# Patient Record
Sex: Female | Born: 2000 | Race: Black or African American | Hispanic: No | Marital: Single | Smoking: Never smoker
Health system: Southern US, Community
[De-identification: ages and names within clinical notes are randomized; demographics above are authoritative.]

## PROBLEM LIST (undated history)

## (undated) DIAGNOSIS — Z789 Other specified health status: Secondary | ICD-10-CM

## (undated) DIAGNOSIS — S329XXA Fracture of unspecified parts of lumbosacral spine and pelvis, initial encounter for closed fracture: Secondary | ICD-10-CM

## (undated) DIAGNOSIS — O139 Gestational [pregnancy-induced] hypertension without significant proteinuria, unspecified trimester: Secondary | ICD-10-CM

## (undated) HISTORY — DX: Gestational (pregnancy-induced) hypertension without significant proteinuria, unspecified trimester: O13.9

## (undated) HISTORY — PX: NO PAST SURGERIES: SHX2092

## (undated) HISTORY — DX: Other specified health status: Z78.9

---
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
## 2018-07-26 DIAGNOSIS — Z113 Encounter for screening for infections with a predominantly sexual mode of transmission: Secondary | ICD-10-CM | POA: Diagnosis not present

## 2018-07-27 DIAGNOSIS — Z0101 Encounter for examination of eyes and vision with abnormal findings: Secondary | ICD-10-CM | POA: Diagnosis not present

## 2018-07-27 DIAGNOSIS — H538 Other visual disturbances: Secondary | ICD-10-CM | POA: Diagnosis not present

## 2018-07-31 DIAGNOSIS — Z3481 Encounter for supervision of other normal pregnancy, first trimester: Secondary | ICD-10-CM | POA: Diagnosis not present

## 2018-07-31 DIAGNOSIS — Z3491 Encounter for supervision of normal pregnancy, unspecified, first trimester: Secondary | ICD-10-CM | POA: Diagnosis not present

## 2018-07-31 DIAGNOSIS — Z01419 Encounter for gynecological examination (general) (routine) without abnormal findings: Secondary | ICD-10-CM | POA: Diagnosis not present

## 2018-07-31 DIAGNOSIS — Z3A10 10 weeks gestation of pregnancy: Secondary | ICD-10-CM | POA: Diagnosis not present

## 2018-08-29 DIAGNOSIS — O351XX0 Maternal care for (suspected) chromosomal abnormality in fetus, not applicable or unspecified: Secondary | ICD-10-CM | POA: Diagnosis not present

## 2018-08-29 DIAGNOSIS — Z3492 Encounter for supervision of normal pregnancy, unspecified, second trimester: Secondary | ICD-10-CM | POA: Diagnosis not present

## 2018-08-29 DIAGNOSIS — Z3A14 14 weeks gestation of pregnancy: Secondary | ICD-10-CM | POA: Diagnosis not present

## 2018-08-29 DIAGNOSIS — N390 Urinary tract infection, site not specified: Secondary | ICD-10-CM | POA: Diagnosis not present

## 2018-09-26 DIAGNOSIS — Z3A18 18 weeks gestation of pregnancy: Secondary | ICD-10-CM | POA: Diagnosis not present

## 2018-09-26 DIAGNOSIS — Z3492 Encounter for supervision of normal pregnancy, unspecified, second trimester: Secondary | ICD-10-CM | POA: Diagnosis not present

## 2018-10-08 DIAGNOSIS — R109 Unspecified abdominal pain: Secondary | ICD-10-CM | POA: Diagnosis not present

## 2018-10-08 DIAGNOSIS — M7918 Myalgia, other site: Secondary | ICD-10-CM | POA: Diagnosis not present

## 2018-10-08 DIAGNOSIS — O26892 Other specified pregnancy related conditions, second trimester: Secondary | ICD-10-CM | POA: Diagnosis not present

## 2018-10-08 DIAGNOSIS — Z3A20 20 weeks gestation of pregnancy: Secondary | ICD-10-CM | POA: Diagnosis not present

## 2018-10-10 DIAGNOSIS — Z3492 Encounter for supervision of normal pregnancy, unspecified, second trimester: Secondary | ICD-10-CM | POA: Diagnosis not present

## 2018-10-10 DIAGNOSIS — Z3A20 20 weeks gestation of pregnancy: Secondary | ICD-10-CM | POA: Diagnosis not present

## 2018-10-10 DIAGNOSIS — Z362 Encounter for other antenatal screening follow-up: Secondary | ICD-10-CM | POA: Diagnosis not present

## 2018-10-12 ENCOUNTER — Encounter: Payer: Self-pay | Admitting: General Practice

## 2018-10-18 ENCOUNTER — Encounter: Payer: Self-pay | Admitting: *Deleted

## 2018-10-18 DIAGNOSIS — Z3400 Encounter for supervision of normal first pregnancy, unspecified trimester: Secondary | ICD-10-CM | POA: Insufficient documentation

## 2018-10-24 ENCOUNTER — Encounter: Payer: Self-pay | Admitting: General Practice

## 2018-10-24 ENCOUNTER — Encounter: Payer: Self-pay | Admitting: Advanced Practice Midwife

## 2018-10-24 ENCOUNTER — Ambulatory Visit (INDEPENDENT_AMBULATORY_CARE_PROVIDER_SITE_OTHER): Payer: Medicaid Other | Admitting: Advanced Practice Midwife

## 2018-10-24 ENCOUNTER — Other Ambulatory Visit: Payer: Self-pay

## 2018-10-24 DIAGNOSIS — Z3402 Encounter for supervision of normal first pregnancy, second trimester: Secondary | ICD-10-CM

## 2018-10-24 DIAGNOSIS — Z3400 Encounter for supervision of normal first pregnancy, unspecified trimester: Secondary | ICD-10-CM

## 2018-10-24 DIAGNOSIS — Z3A22 22 weeks gestation of pregnancy: Secondary | ICD-10-CM | POA: Diagnosis not present

## 2018-10-24 NOTE — Progress Notes (Signed)
Subjective:   Shannon Hogan is a 18 y.o. G1P0 at 3641w2d by LMP being seen today for her first obstetrical visit.  Her obstetrical history is significant for none. Patient does intend to breast feed. Pregnancy history fully reviewed.  Patient reports backache.  HISTORY: OB History  Gravida Para Term Preterm AB Living  1 0 0 0 0 0  SAB TAB Ectopic Multiple Live Births  0 0 0 0 0    # Outcome Date GA Lbr Len/2nd Weight Sex Delivery Anes PTL Lv  1 Current             Last pap smear was done NA, age and was NA age  Past Medical History:  Diagnosis Date  . Medical history non-contributory    Past Surgical History:  Procedure Laterality Date  . NO PAST SURGERIES     Family History  Problem Relation Age of Onset  . Stroke Paternal Grandmother   . Glaucoma Paternal Grandmother   . Diabetes Paternal Grandmother    Social History   Tobacco Use  . Smoking status: Never Smoker  . Smokeless tobacco: Never Used  Substance Use Topics  . Alcohol use: No  . Drug use: Not Currently    Types: Marijuana   No Known Allergies Current Outpatient Medications on File Prior to Visit  Medication Sig Dispense Refill  . aspirin EC 81 MG tablet Take 81 mg by mouth daily.    . Prenatal Vit-Fe Fumarate-FA (PRENATAL VITAMIN PO) Take 1 tablet by mouth daily.     No current facility-administered medications on file prior to visit.     Review of Systems Pertinent items noted in HPI and remainder of comprehensive ROS otherwise negative.  Exam   Vitals:   10/18/18 1144 10/24/18 0823  BP:  109/68  Pulse:  96  Temp:  98.4 F (36.9 C)  Weight:  137 lb 3.2 oz (62.2 kg)  Height: 5' 2.5" (1.588 m)    Fetal Heart Rate (bpm): 148  Physical Exam  Constitutional: She is oriented to person, place, and time and well-developed, well-nourished, and in no distress. No distress.  HENT:  Head: Normocephalic.  Cardiovascular: Normal rate.  Pulmonary/Chest: Effort normal.  Abdominal: Soft. There is  no abdominal tenderness. There is no rebound.  Neurological: She is alert and oriented to person, place, and time.  Skin: Skin is warm and dry.  Psychiatric: Affect normal.  Nursing note and vitals reviewed.    Assessment:   Pregnancy: G1P0 Patient Active Problem List   Diagnosis Date Noted  . Supervision of normal first pregnancy, antepartum 10/18/2018     Plan:  1. Supervision of normal first pregnancy, antepartum - reviewed prenatal records  - 28 week labs at next visit - patient reports that prior OB office called and wanted to schedule a repeat US. Results not available. Will request records and schedule US if needed. US report on file has mild bilateral renal pyelectasis reported. Unsure if this is why they would like to repeat the US.  - Enroll Patient in Babyscripts   Initial labs drawn. Continue prenatal vitamins. Genetic Screening discussed, reviewed records : results pending from prior office . Ultrasound discussed; fetal anatomic survey: results pending from prior office. . Problem list reviewed and updated. The nature of [facility] [facility] with multiple MDs and other Advanced Practice Providers was explained to patient; also emphasized that residents, students are part of our team. Routine obstetric precautions reviewed. 50% of 45 min visit  spent in counseling and coordination of care. Return in about 6 weeks (around 12/05/2018) for in person visit and 28 week labs .  Marcille Buffy DNP, CNM  10/24/18  9:14 AM

## 2018-10-24 NOTE — Patient Instructions (Addendum)
Marland [facility], MD; Suzan Slick, MD o [place], [place], [place] [address] o [phone] o Mon-Fri 8:30-5:00, Sat 8:30-12:00 o Providers come to see babies at [facility] o Does NOT accept Medicaid  Childbirth Education Options: [facility] Department Classes:  Childbirth education classes can help you get ready for a positive parenting experience. You can also meet other expectant parents and get free stuff for your baby. Each class runs for five weeks on the same night and costs $45 for the mother-to-be and her support person. Medicaid covers the cost if you are eligible. Call [phone] to register. [facility] Childbirth Education:  [phone] or [phone] or [email or web address]  Baby & Me Class: Discuss newborn & infant parenting and family adjustment issues with other new mothers in a relaxed environment. Each week brings a new speaker or baby-centered activity. We encourage new mothers to join [place] every Thursday at 11:00am. Babies birth until crawling. No registration or fee. Daddy [employer]: This course offers Dads-to-be the tools and knowledge needed to feel confident on their journey to becoming new fathers. Experienced dads, who have been trained as coaches, teach dads-to-be how to hold, comfort, diaper, swaddle and play with their infant while being able to support the new mom as well. A class for men taught by men. $25/dad Big Brother/Big Sister: Let your children share in the joy of a new brother or sister in this special class designed just for them. Class includes discussion about how families care for babies: swaddling, holding, diapering, safety as well as how they can be helpful in their new role. This class is designed for children ages 31 to 1, but any age is welcome. Please register each child individually. $5/child  Mom Talk: This mom-led group offers support and connection to mothers as they journey through  the adjustments and struggles of that sometimes overwhelming first year after the birth of a child. Tuesdays at 10:00am and Thursdays at 6:00pm. Babies welcome. No registration or fee. Breastfeeding Support Group: This group is a mother-to-mother support circle where moms have the opportunity to share their breastfeeding experiences. A Lactation Consultant is present for questions and concerns. Meets each Tuesday at 11:00am. No fee or registration. Breastfeeding Your Baby: Learn what to expect in the first days of breastfeeding your newborn.  This class will help you feel more confident with the skills needed to begin your breastfeeding experience. Many new mothers are concerned about breastfeeding after leaving the hospital. This class will also address the most common fears and challenges about breastfeeding during the first few weeks, months and beyond. (call for fee) Comfort Techniques and Tour: This 2 hour interactive class will provide you the opportunity to learn & practice hands-on techniques that can help relieve some of the discomfort of labor and encourage your baby to rotate toward the best position for birth. You and your partner will be able to try a variety of labor positions with birth balls and rebozos as well as practice breathing, relaxation, and visualization techniques. A tour of the [facility] is included with this class. $20 per registrant and support person Childbirth Class- Weekend Option: This class is a Weekend version of our Birth & Baby series. It is designed for parents who have a difficult time fitting several weeks of classes into their schedule. It covers the care of your newborn and the basics of labor and childbirth. It also includes a [place] [facility] and lunch. The class is held two consecutive days: beginning on Friday evening from 6:30 - 8:30 p.m. and the next day, Saturday from 9 a.m. - 4 p.m. (call for  fee) Doren Custard Class: Interested in a waterbirth?  This informational class will help you discover whether waterbirth is the right fit for you. Education about waterbirth itself, supplies you would need and how to assemble your support team is what you can expect from this class. Some obstetrical practices require this class in order to pursue a waterbirth. (Not all obstetrical practices offer waterbirth-check with your healthcare provider.) Register only the expectant mom, but you are encouraged to bring your partner to class! Required if planning waterbirth, no fee. Infant/Child CPR: Parents, grandparents, babysitters, and friends learn Cardio-Pulmonary Resuscitation skills for infants and children. You will also learn how to treat both conscious and unconscious choking in infants and children. This Family & Friends program does not offer certification. Register each participant individually to ensure that enough mannequins are available. (Call for fee) Grandparent Love: Expecting a grandbaby? This class is for you! Learn about the latest infant care and safety recommendations and ways to support your own child as he or she transitions into the parenting role. Taught by Registered Nurses who are childbirth instructors, but most importantly...they are grandmothers too! $10/person. Childbirth Class- Natural Childbirth: This series of 5 weekly classes is for expectant parents who want to learn and practice natural methods of coping with the process of labor and childbirth. Relaxation, breathing, massage, visualization, role of the partner, and helpful positioning are highlighted. Participants learn how to be confident in their body's ability to give birth. This class will empower and help parents make informed decisions about their own care. Includes discussion that will help new parents transition into the immediate postpartum period. [facility] is included. We suggest taking  this class between 25-32 weeks, but it's only a recommendation. $75 per registrant and one support person or $30 Medicaid. Childbirth Class- 3 week Series: This option of 3 weekly classes helps you and your labor partner prepare for childbirth. Newborn care, labor & birth, cesarean birth, pain management, and comfort techniques are discussed and a [place] [facility] is included. The class meets at the same time, on the same day of the week for 3 consecutive weeks beginning with the starting date you choose. $60 for registrant and one support person.  Marvelous Multiples: Expecting twins, triplets, or more? This class covers the differences in labor, birth, parenting, and breastfeeding issues that face multiples' parents. NICU tour is included. Led by a Certified Childbirth Educator who is the mother of twins. No fee. Caring for Baby: This class is for expectant and adoptive parents who want to learn and practice the most up-to-date newborn care for their babies. Focus is on birth through the first six weeks of life. Topics include feeding, bathing, diapering, crying, umbilical cord care, circumcision care and safe sleep. Parents learn to recognize symptoms of illness and when to call the pediatrician. Register only the mom-to-be and your partner or support person can plan to come with you! $10 per registrant and support person Childbirth Class- online option: This online class offers you the freedom to complete a Birth and Baby series in the comfort of your own home. The flexibility of this option allows you to review sections at your own pace, at times convenient to you and your support people. It includes additional video  information, animations, quizzes, and extended activities. Get organized with helpful eClass tools, checklists, and trackers. Once you register online for the class, you will receive an email within a few days to accept the invitation and begin the class when the  time is right for you. The content will be available to you for 60 days. $60 for 60 days of online access for you and your support people.  Local Doulas: Natural Baby Doulas [email or web address] Tel: [phone] [email or web address] [employer] [phone] [email or web address] The Labor Hassell Halim  (also do waterbirth tub rental) [phone] [email or web address] [facility] Birth Doula [phone] [email or web address] NotebookDistributors.fi [facility] Rhythms  [phone] https://[email or web address]-[email or web address]/ [employer] (PADA) [email or web address]://[email or web address]/ [place] [facility]  http://[email or web address]/ Considering Waterbirth? Guide for patients at [facility] [employer]  Why consider waterbirth?  . Gentle birth for babies . Less pain medicine used in labor . May allow for passive descent/less pushing . May reduce perineal tears  . More mobility and instinctive maternal position changes . Increased maternal relaxation . Reduced blood pressure in labor  Is waterbirth safe? What are the risks of infection, drowning or other complications?  . Infection: o Very low risk (3.7 % for tub vs 4.8% for bed) o 7 in 8000 waterbirths with documented infection o Poorly cleaned equipment most common cause o Slightly lower group B strep transmission rate  . Drowning o Maternal:  - Very low risk   - Related to seizures or fainting o Newborn:  - Very low risk. No evidence of increased risk of respiratory problems in multiple large studies - Physiological protection from breathing under water - Avoid underwater birth if there are any fetal complications - Once baby's head is out of the water, keep it out.  . Birth complication o Some reports of cord trauma, but risk decreased by bringing baby to surface gradually o No evidence  of increased risk of shoulder dystocia. Mothers can usually change positions faster in water than in a bed, possibly aiding the maneuvers to free the shoulder.   You must attend a Doren Custard class at [facility]  3rd Wednesday of every month from 7-9pm  [employer] by calling [phone] or online at [email or web address]  Bring [place] the certificate from the class to your prenatal appointment  Meet with a midwife at 36 weeks to see if you can still plan a waterbirth and to sign the consent.   Purchase or rent the following supplies:   Bathing suit top (optional)  Long-handled mirror (optional)  Places to purchase or rent supplies  GotWebTools.is for tub purchases and supplies  Waterbirthsolutions.com for tub purchases and supplies  The Labor Ladies (www.thelaborladies.com) $275 for tub rental/set-up & take down/kit   [employer] (http://www.[place].com/.htm) Information regarding doulas (labor support) who provide pool rentals  Our practice has a Birth Pool in a Box tub at the hospital that you may borrow on a first-come-first-served basis. It is your responsibility to to set up, clean and break down the tub. We cannot guarantee the availability of this tub in advance. You are responsible for bringing all accessories listed above. If you do not have all necessary supplies you cannot have a waterbirth.    Things that would prevent you from having a waterbirth:  Premature, <37wks  Previous cesarean birth  Presence of thick meconium-stained fluid  Multiple gestation (Twins, triplets, etc.)  Uncontrolled diabetes or gestational diabetes requiring medication  Hypertension requiring medication or diagnosis of pre-eclampsia  Heavy vaginal bleeding  Non-reassuring fetal  heart rate  Active infection (MRSA, etc.). Group B Strep is NOT a contraindication for  waterbirth.  If your labor has to be induced and induction method requires  continuous  monitoring of the baby's heart rate  Other risks/issues identified by your obstetrical provider  Please remember that birth is unpredictable. Under certain unforeseeable circumstances your provider may advise against giving birth in the tub. These decisions will be made on a case-by-case basis and with the safety of you and your baby as our highest priority.

## 2018-10-25 ENCOUNTER — Encounter: Payer: Self-pay | Admitting: General Practice

## 2018-10-26 ENCOUNTER — Other Ambulatory Visit: Payer: Self-pay | Admitting: Obstetrics & Gynecology

## 2018-10-26 MED ORDER — FLUCONAZOLE 150 MG PO TABS: 150.0000 mg | ORAL_TABLET | Freq: Once | ORAL | 3 refills | Status: AC

## 2018-10-26 NOTE — Progress Notes (Signed)
Diflucan prescribed per patient request.

## 2018-11-05 DIAGNOSIS — O0990 Supervision of high risk pregnancy, unspecified, unspecified trimester: Secondary | ICD-10-CM | POA: Diagnosis not present

## 2018-11-12 DIAGNOSIS — H5213 Myopia, bilateral: Secondary | ICD-10-CM | POA: Diagnosis not present

## 2018-11-21 ENCOUNTER — Other Ambulatory Visit: Payer: Self-pay

## 2018-11-21 ENCOUNTER — Ambulatory Visit (INDEPENDENT_AMBULATORY_CARE_PROVIDER_SITE_OTHER): Payer: Medicaid Other | Admitting: *Deleted

## 2018-11-21 ENCOUNTER — Other Ambulatory Visit (HOSPITAL_COMMUNITY)
Admission: RE | Admit: 2018-11-21 | Discharge: 2018-11-21 | Disposition: A | Discharge status: Home / Self Care | Payer: Medicaid Other | Source: Ambulatory Visit | Attending: Obstetrics and Gynecology | Admitting: Obstetrics and Gynecology

## 2018-11-21 VITALS — BP 119/70 | HR 128 | Temp 98.5°F | Ht 62.5 in | Wt 150.0 lb

## 2018-11-21 DIAGNOSIS — O26899 Other specified pregnancy related conditions, unspecified trimester: Secondary | ICD-10-CM | POA: Diagnosis not present

## 2018-11-21 DIAGNOSIS — N898 Other specified noninflammatory disorders of vagina: Secondary | ICD-10-CM

## 2018-11-21 NOTE — Progress Notes (Signed)
   SUBJECTIVE:  18 y.o. female complains of vaginal itching and vaginal discharge for several day(s). Denies abnormal vaginal bleeding or significant pelvic pain or fever. No UTI symptoms. Denies history of known exposure to STD.  Patient's last menstrual period was 05/06/2018 (exact date).  OBJECTIVE:  She appears well, afebrile. Urine dipstick: not done.  ASSESSMENT:  Vaginal Discharge  Vaginal Itching   PLAN:  GC, chlamydia, trichomonas, BVAG, CVAG probe sent to lab. Treatment: To be determined once lab results are received ROV prn if symptoms persist or worsen. Patient requested Rx for Diflucan. Advised patient that the nurse does not have standing orders for pregnant women for Diflucan. Will have to wait until Midwife review labs and record.  Derl Barrow, RN

## 2018-11-22 ENCOUNTER — Telehealth: Payer: Self-pay | Admitting: *Deleted

## 2018-11-22 LAB — CERVICOVAGINAL ANCILLARY ONLY
Bacterial Vaginitis (gardnerella): NEGATIVE
Candida Glabrata: NEGATIVE
Candida Vaginitis: POSITIVE — AB
Chlamydia: NEGATIVE
Molecular Disclaimer: NEGATIVE
Molecular Disclaimer: NEGATIVE
Molecular Disclaimer: NEGATIVE
Molecular Disclaimer: NEGATIVE
Molecular Disclaimer: NORMAL
Molecular Disclaimer: NORMAL
Neisseria Gonorrhea: NEGATIVE
Trichomonas: NEGATIVE

## 2018-11-22 MED ORDER — FLUCONAZOLE 150 MG PO TABS: 150.0000 mg | ORAL_TABLET | ORAL | 0 refills | Status: DC

## 2018-11-22 NOTE — Telephone Encounter (Addendum)
Patient called requesting results from visit 11/21/2018. She stated she is suffering and requested Diflucan be sent to pharmacy. Advised patient, the results are not back at this and the nurse did not have standing orders to send Diflucan, only vaginal cream. Patient stated she called and a nurse from our clinic just asked a midwife and sent Diflucan to pharmacy without being seen. Advised her again that Diflucan is not a standing order for the nurse to send in and would need to speak with midwife.  Verbal order given by Laury Deep, CNM for Diflucan 150 mg PO every 3 days x 3 (now, day 4 and day 7).  Derl Barrow, RN

## 2018-11-23 DIAGNOSIS — Z0101 Encounter for examination of eyes and vision with abnormal findings: Secondary | ICD-10-CM | POA: Diagnosis not present

## 2018-11-23 DIAGNOSIS — H5210 Myopia, unspecified eye: Secondary | ICD-10-CM | POA: Diagnosis not present

## 2018-11-26 ENCOUNTER — Telehealth: Payer: Self-pay | Admitting: *Deleted

## 2018-11-26 NOTE — Telephone Encounter (Signed)
Patient called requesting test results from last visit. Pt was positive for yeast, treatment was sent to pharmacy.  Pt also requested to speak with provider regarding having another ultrasound completed.  Pt has upcoming appt on 12/07/2018. Will forward to provider.  Derl Barrow, RN

## 2018-11-27 ENCOUNTER — Other Ambulatory Visit: Payer: Self-pay | Admitting: Advanced Practice Midwife

## 2018-11-27 DIAGNOSIS — O35EXX0 Maternal care for other (suspected) fetal abnormality and damage, fetal genitourinary anomalies, not applicable or unspecified: Secondary | ICD-10-CM

## 2018-11-27 DIAGNOSIS — Z3400 Encounter for supervision of normal first pregnancy, unspecified trimester: Secondary | ICD-10-CM

## 2018-11-27 DIAGNOSIS — O358XX0 Maternal care for other (suspected) fetal abnormality and damage, not applicable or unspecified: Secondary | ICD-10-CM

## 2018-11-28 ENCOUNTER — Telehealth: Payer: Self-pay | Admitting: General Practice

## 2018-11-28 NOTE — Telephone Encounter (Signed)
Pt aware of [place] appt scheduled for 12/13/2018 at 10:30am and voiced understanding.

## 2018-12-07 ENCOUNTER — Ambulatory Visit (INDEPENDENT_AMBULATORY_CARE_PROVIDER_SITE_OTHER): Payer: Medicaid Other | Admitting: Advanced Practice Midwife

## 2018-12-07 ENCOUNTER — Encounter: Payer: Self-pay | Admitting: Advanced Practice Midwife

## 2018-12-07 ENCOUNTER — Other Ambulatory Visit: Payer: Self-pay

## 2018-12-07 VITALS — BP 114/79 | HR 114 | Temp 98.4°F | Wt 152.0 lb

## 2018-12-07 DIAGNOSIS — Z3A28 28 weeks gestation of pregnancy: Secondary | ICD-10-CM

## 2018-12-07 DIAGNOSIS — Z23 Encounter for immunization: Secondary | ICD-10-CM

## 2018-12-07 DIAGNOSIS — Z3403 Encounter for supervision of normal first pregnancy, third trimester: Secondary | ICD-10-CM

## 2018-12-07 DIAGNOSIS — Z3400 Encounter for supervision of normal first pregnancy, unspecified trimester: Secondary | ICD-10-CM | POA: Diagnosis not present

## 2018-12-07 NOTE — Patient Instructions (Signed)

## 2018-12-07 NOTE — Progress Notes (Signed)
   PRENATAL VISIT NOTE  Subjective:  Shannon Hogan is a 18 y.o. G1P0 at [redacted]w[redacted]d being seen today for ongoing prenatal care.  She is currently monitored for the following issues for this low-risk pregnancy and has Supervision of normal first pregnancy, antepartum on their problem list.  Patient reports no complaints.  Contractions: Not present. Vag. Bleeding: None.  Movement: Present. Denies leaking of fluid.   The following portions of the patient's history were reviewed and updated as appropriate: allergies, current medications, past family history, past medical history, past social history, past surgical history and problem list.   Objective:   Vitals:   12/07/18 0815  BP: 114/79  Pulse: (!) 114  Temp: 98.4 F (36.9 C)  Weight: 152 lb (68.9 kg)    Fetal Status:   Fundal Height: 29 cm Movement: Present  Presentation: Complete Breech  General:  Alert, oriented and cooperative. Patient is in no acute distress.  Skin: Skin is warm and dry. No rash noted.   Cardiovascular: Normal heart rate noted  Respiratory: Normal respiratory effort, no problems with respiration noted  Abdomen: Soft, gravid, appropriate for gestational age.  Pain/Pressure: Present     Pelvic: Cervical exam deferred        Extremities: Normal range of motion.  Edema: None  Mental Status: Normal mood and affect. Normal behavior. Normal judgment and thought content.   Assessment and Plan:  Pregnancy: G1P0 at [redacted]w[redacted]d 1. Supervision of normal first pregnancy, antepartum - Routine care - Glucose Tolerance, 2 Hours w/1 Hour - HIV Antibody (routine testing w rflx) - RPR - CBC - Tdap vaccine greater than or equal to 7yo IM - Breech by leopolds today   2. Need for tetanus, diphtheria, and acellular pertussis (Tdap) vaccine in patient of adolescent age or older - Tdap vaccine greater than or equal to 7yo IM  Preterm labor symptoms and general obstetric precautions including but not limited to vaginal bleeding,  contractions, leaking of fluid and fetal movement were reviewed in detail with the patient. Please refer to After Visit Summary for other counseling recommendations.   Return in about 2 weeks (around 12/21/2018) for Virtual visit .  Future Appointments  Date Time Provider [place]  12/13/2018 10:30 AM WH-MFC [place] 1 WH-MFCUS MFC-US    Jeda Pardue DNP, CNM  12/07/18  8:42 AM

## 2018-12-08 LAB — CBC
Hematocrit: 30.5 % — ABNORMAL LOW (ref 34.0–46.6)
Hemoglobin: 9.7 g/dL — ABNORMAL LOW (ref 11.1–15.9)
MCH: 26.1 pg — ABNORMAL LOW (ref 26.6–33.0)
MCHC: 31.8 g/dL (ref 31.5–35.7)
MCV: 82 fL (ref 79–97)
Platelets: 227 10*3/uL (ref 150–450)
RBC: 3.72 x10E6/uL — ABNORMAL LOW (ref 3.77–5.28)
RDW: 12.6 % (ref 11.7–15.4)
WBC: 11.1 10*3/uL — ABNORMAL HIGH (ref 3.4–10.8)

## 2018-12-08 LAB — GLUCOSE TOLERANCE, 2 HOURS W/ 1HR
Glucose, 1 hour: 165 mg/dL (ref 65–179)
Glucose, 2 hour: 109 mg/dL (ref 65–152)
Glucose, Fasting: 83 mg/dL (ref 65–91)

## 2018-12-08 LAB — RPR: RPR Ser Ql: NONREACTIVE

## 2018-12-08 LAB — HIV ANTIBODY (ROUTINE TESTING W REFLEX): HIV Screen 4th Generation wRfx: NONREACTIVE

## 2018-12-13 ENCOUNTER — Other Ambulatory Visit: Payer: Self-pay

## 2018-12-13 ENCOUNTER — Other Ambulatory Visit (HOSPITAL_COMMUNITY): Payer: Self-pay | Admitting: *Deleted

## 2018-12-13 ENCOUNTER — Ambulatory Visit (HOSPITAL_COMMUNITY)
Admission: RE | Admit: 2018-12-13 | Discharge: 2018-12-13 | Disposition: A | Discharge status: Home / Self Care | Payer: Medicaid Other | Source: Ambulatory Visit | Attending: Obstetrics and Gynecology | Admitting: Obstetrics and Gynecology

## 2018-12-13 ENCOUNTER — Other Ambulatory Visit: Payer: Self-pay | Admitting: Advanced Practice Midwife

## 2018-12-13 DIAGNOSIS — Z3400 Encounter for supervision of normal first pregnancy, unspecified trimester: Secondary | ICD-10-CM | POA: Insufficient documentation

## 2018-12-13 DIAGNOSIS — O35EXX0 Maternal care for other (suspected) fetal abnormality and damage, fetal genitourinary anomalies, not applicable or unspecified: Secondary | ICD-10-CM

## 2018-12-13 DIAGNOSIS — O358XX0 Maternal care for other (suspected) fetal abnormality and damage, not applicable or unspecified: Secondary | ICD-10-CM

## 2018-12-13 DIAGNOSIS — O3660X0 Maternal care for excessive fetal growth, unspecified trimester, not applicable or unspecified: Secondary | ICD-10-CM

## 2018-12-13 DIAGNOSIS — Z3A29 29 weeks gestation of pregnancy: Secondary | ICD-10-CM | POA: Diagnosis not present

## 2018-12-19 ENCOUNTER — Telehealth: Payer: Medicaid Other | Admitting: Obstetrics and Gynecology

## 2018-12-25 ENCOUNTER — Encounter: Payer: Self-pay | Admitting: General Practice

## 2018-12-26 ENCOUNTER — Encounter: Payer: Self-pay | Admitting: General Practice

## 2018-12-26 ENCOUNTER — Telehealth: Payer: Self-pay | Admitting: General Practice

## 2018-12-26 NOTE — Telephone Encounter (Signed)
Pt called office after hours and was triaged by RN and it was recommended that pt go to [facility]U.  Pt did not go to the hospital.  Called patient to schedule follow up appt that she missed on 12/19/2018 via Mychart.  Pt did not answer.  Left message on VM for pt to give our office a call to reschedule.

## 2018-12-27 ENCOUNTER — Other Ambulatory Visit (HOSPITAL_COMMUNITY)
Admission: RE | Admit: 2018-12-27 | Discharge: 2018-12-27 | Disposition: A | Discharge status: Home / Self Care | Payer: Medicaid Other | Source: Ambulatory Visit | Attending: Obstetrics and Gynecology | Admitting: Obstetrics and Gynecology

## 2018-12-27 ENCOUNTER — Encounter: Payer: Self-pay | Admitting: Obstetrics and Gynecology

## 2018-12-27 ENCOUNTER — Other Ambulatory Visit: Payer: Self-pay

## 2018-12-27 ENCOUNTER — Ambulatory Visit (INDEPENDENT_AMBULATORY_CARE_PROVIDER_SITE_OTHER): Payer: Medicaid Other | Admitting: Obstetrics and Gynecology

## 2018-12-27 VITALS — BP 125/74 | HR 118 | Temp 98.5°F | Wt 155.8 lb

## 2018-12-27 DIAGNOSIS — B373 Candidiasis of vulva and vagina: Secondary | ICD-10-CM

## 2018-12-27 DIAGNOSIS — O26899 Other specified pregnancy related conditions, unspecified trimester: Secondary | ICD-10-CM

## 2018-12-27 DIAGNOSIS — Z3400 Encounter for supervision of normal first pregnancy, unspecified trimester: Secondary | ICD-10-CM

## 2018-12-27 DIAGNOSIS — Z3A31 31 weeks gestation of pregnancy: Secondary | ICD-10-CM

## 2018-12-27 DIAGNOSIS — N898 Other specified noninflammatory disorders of vagina: Secondary | ICD-10-CM

## 2018-12-27 DIAGNOSIS — O98813 Other maternal infectious and parasitic diseases complicating pregnancy, third trimester: Secondary | ICD-10-CM

## 2018-12-27 DIAGNOSIS — O26893 Other specified pregnancy related conditions, third trimester: Secondary | ICD-10-CM

## 2018-12-27 DIAGNOSIS — B3731 Acute candidiasis of vulva and vagina: Secondary | ICD-10-CM

## 2018-12-27 DIAGNOSIS — B372 Candidiasis of skin and nail: Secondary | ICD-10-CM

## 2018-12-27 MED ORDER — NYSTATIN 100000 UNIT/GM EX OINT: 1.0000 "application " | TOPICAL_OINTMENT | Freq: Two times a day (BID) | CUTANEOUS | 0 refills | Status: DC

## 2018-12-27 MED ORDER — FLUCONAZOLE 150 MG PO TABS: 150.0000 mg | ORAL_TABLET | ORAL | 0 refills | Status: AC

## 2018-12-27 NOTE — Progress Notes (Signed)
     LOW-RISK PREGNANCY OFFICE VISIT Patient name: Shannon Hogan MRN [id number]  Date of birth: 29-Jan-2001 Chief Complaint:   Routine Prenatal Visit  History of Present Illness:   Shannon Hogan is a 18 y.o. G1P0 female at [redacted]w[redacted]d with an Estimated Date of Delivery: 02/25/19 being seen today for ongoing management of a low-risk pregnancy.  Today she reports vaginal irritation and thick, off-white vaginal discharge and vaginal itching. Contractions: Irregular. Vag. Bleeding: None.  Movement: Present. denies leaking of fluid. Review of Systems:   Pertinent items are noted in HPI Denies abnormal vaginal discharge w/ itching/odor/irritation, headaches, visual changes, shortness of breath, chest pain, abdominal pain, severe nausea/vomiting, or problems with urination or bowel movements unless otherwise stated above. Pertinent History Reviewed:  Reviewed past medical,surgical, social, obstetrical and family history.  Reviewed problem list, medications and allergies. Physical Assessment:   Vitals:   12/27/18 1550  BP: 125/74  Pulse: (!) 118  Temp: 98.5 F (36.9 C)  Weight: 155 lb 12.8 oz (70.7 kg)  Body mass index is 28.04 kg/m.        Physical Examination:   General appearance: Well appearing, and in no distress  Mental status: Alert, oriented to person, place, and time  Skin: Warm & dry  Cardiovascular: Normal heart rate noted  Respiratory: Normal respiratory effort, no distress  Abdomen: Soft, gravid, nontender  Pelvic: Cervical exam deferred         Extremities: Edema: None  Fetal Status: Fetal Heart Rate (bpm): 154 Fundal Height: 32 cm Movement: Present    Assessment & Plan:  1) Low-risk pregnancy G1P0 at [redacted]w[redacted]d with an Estimated Date of Delivery: 02/25/19   2) Supervision of normal first pregnancy, antepartum  Vaginal discharge during pregnancy, antepartum  - Plan: Cervicovaginal ancillary only( )  3) Candida vaginitis  - Rx for fluconazole (DIFLUCAN) 150 MG  tablet  4) Cutaneous candidiasis  -  Rx for nystatin ointment (MYCOSTATIN)     Meds:  Meds ordered this encounter  Medications  . fluconazole (DIFLUCAN) 150 MG tablet    Sig: Take 1 tablet (150 mg total) by mouth every 3 (three) days for 3 doses. Take 1 tablet today, Sunday 12/30/18 & 01/01/19    Dispense:  3 tablet    Refill:  0    Order Specific Question:   Supervising Provider    Answer:   Donnamae Jude [[id number]]  . nystatin ointment (MYCOSTATIN)    Sig: Apply 1 application topically 2 (two) times daily. Apply to external areas only    Dispense:  30 g    Refill:  0    Order Specific Question:   Supervising Provider    Answer:   Donnamae Jude [[id number]]   Labs/procedures today: none  Plan:  Continue routine obstetrical care   Reviewed: Preterm labor symptoms and general obstetric precautions including but not limited to vaginal bleeding, contractions, leaking of fluid and fetal movement were reviewed in detail with the patient.  All questions were answered. Has home bp cuff.  Check bp weekly, let us know if >140/90.   Follow-up: Return in about 4 weeks (around 01/24/2019) for Return OB w/GBS.  No orders of the defined types were placed in this encounter.  Laury Deep MSN, CNM 12/27/2018 4:20 PM

## 2018-12-27 NOTE — Patient Instructions (Signed)
Alternative Vaginitis Therapies  1) soak in tub of warm water waist high with 1/2 cup of baking soda in water for ~ 20 mins. 2) soak 3 tampons in 1 tablespoon of fractionated (liquid form) coconut oil with 10 drops of Melaleuca (Tea Tree) essential oil, insert 1 saturated tampon vaginally at bedtime x 3 days. Both options are to be done after sexual intercourse, menses and when suspects BV and/or yeast infection. Advised that these alternatives will not replace the need to be evaluated, if sx's persist. You will need to seek care at an OB/GYN provider.  GO WHITE: Soap: UNSCENTED Dove (white box light green writing) Laundry detergent (underwear)- Dreft or [employer] unscented WHITE 100% cotton panties (NOT just cotton crouch) Sanitary napkin/panty liners: UNSCENTED.  If it doesn't SAY unscented it can have a scent/perfume    NO PERFUMES OR LOTIONS OR POTIONS in the vulvar area (may use regular [place]) Condoms: hypoallergenic only. Non dyed (no color) Toilet papers: white only Wash clothes: use a separate wash cloth. WHITE.  Wash in Dreft.   You can purchase Tea Tree Oil locally at:  [employer] [address] [place], [place] [address] [phone]   [employer] [address] [place], [place] [address] [phone]  

## 2019-01-01 LAB — CERVICOVAGINAL ANCILLARY ONLY
Bacterial Vaginitis (gardnerella): NEGATIVE
Candida Glabrata: NEGATIVE
Candida Vaginitis: POSITIVE — AB
Chlamydia: NEGATIVE
Comment: NEGATIVE
Comment: NEGATIVE
Comment: NEGATIVE
Comment: NEGATIVE
Comment: NEGATIVE
Comment: NORMAL
Neisseria Gonorrhea: NEGATIVE
Trichomonas: NEGATIVE

## 2019-01-02 ENCOUNTER — Telehealth: Payer: Self-pay | Admitting: *Deleted

## 2019-01-02 DIAGNOSIS — B373 Candidiasis of vulva and vagina: Secondary | ICD-10-CM

## 2019-01-02 DIAGNOSIS — B3731 Acute candidiasis of vulva and vagina: Secondary | ICD-10-CM

## 2019-01-02 MED ORDER — TERCONAZOLE 0.4 % VA CREA: 1.0000 | TOPICAL_CREAM | Freq: Every day | VAGINAL | 0 refills | Status: DC

## 2019-01-02 NOTE — Telephone Encounter (Signed)
-----   Message from Laury Deep, [place] sent at 01/02/2019  9:19 AM EST ----- Please treat for yeast

## 2019-01-10 ENCOUNTER — Ambulatory Visit (HOSPITAL_COMMUNITY)
Admission: RE | Admit: 2019-01-10 | Discharge: 2019-01-10 | Disposition: A | Discharge status: Home / Self Care | Payer: Medicaid Other | Source: Ambulatory Visit | Attending: Obstetrics and Gynecology | Admitting: Obstetrics and Gynecology

## 2019-01-10 ENCOUNTER — Other Ambulatory Visit: Payer: Self-pay

## 2019-01-10 ENCOUNTER — Other Ambulatory Visit (HOSPITAL_COMMUNITY): Payer: Self-pay | Admitting: *Deleted

## 2019-01-10 DIAGNOSIS — Z3A33 33 weeks gestation of pregnancy: Secondary | ICD-10-CM

## 2019-01-10 DIAGNOSIS — O3663X0 Maternal care for excessive fetal growth, third trimester, not applicable or unspecified: Secondary | ICD-10-CM

## 2019-01-10 DIAGNOSIS — O3663X1 Maternal care for excessive fetal growth, third trimester, fetus 1: Secondary | ICD-10-CM | POA: Diagnosis not present

## 2019-01-10 DIAGNOSIS — Z362 Encounter for other antenatal screening follow-up: Secondary | ICD-10-CM | POA: Diagnosis not present

## 2019-01-10 DIAGNOSIS — O3660X0 Maternal care for excessive fetal growth, unspecified trimester, not applicable or unspecified: Secondary | ICD-10-CM | POA: Insufficient documentation

## 2019-01-15 ENCOUNTER — Encounter (HOSPITAL_COMMUNITY): Payer: Self-pay

## 2019-01-23 DIAGNOSIS — O3663X0 Maternal care for excessive fetal growth, third trimester, not applicable or unspecified: Secondary | ICD-10-CM | POA: Insufficient documentation

## 2019-01-24 ENCOUNTER — Other Ambulatory Visit (HOSPITAL_COMMUNITY)
Admission: RE | Admit: 2019-01-24 | Discharge: 2019-01-24 | Disposition: A | Discharge status: Home / Self Care | Payer: Medicaid Other | Source: Ambulatory Visit | Attending: Obstetrics and Gynecology | Admitting: Obstetrics and Gynecology

## 2019-01-24 ENCOUNTER — Other Ambulatory Visit: Payer: Self-pay

## 2019-01-24 ENCOUNTER — Ambulatory Visit (INDEPENDENT_AMBULATORY_CARE_PROVIDER_SITE_OTHER): Payer: Medicaid Other | Admitting: Obstetrics and Gynecology

## 2019-01-24 ENCOUNTER — Encounter: Payer: Self-pay | Admitting: Obstetrics and Gynecology

## 2019-01-24 VITALS — BP 124/77 | HR 97 | Temp 98.0°F | Wt 163.4 lb

## 2019-01-24 DIAGNOSIS — Z3A35 35 weeks gestation of pregnancy: Secondary | ICD-10-CM

## 2019-01-24 DIAGNOSIS — Z3400 Encounter for supervision of normal first pregnancy, unspecified trimester: Secondary | ICD-10-CM | POA: Diagnosis not present

## 2019-01-24 DIAGNOSIS — O3663X0 Maternal care for excessive fetal growth, third trimester, not applicable or unspecified: Secondary | ICD-10-CM

## 2019-01-24 NOTE — Patient Instructions (Signed)

## 2019-01-24 NOTE — Progress Notes (Signed)
   LOW-RISK PREGNANCY OFFICE VISIT Patient name: Shannon Hogan MRN [id number]  Date of birth: February 19, 2001 Chief Complaint:   Routine Prenatal Visit  History of Present Illness:   Elyssa Pendelton is a 18 y.o. G1P0 female at [redacted]w[redacted]d with an Estimated Date of Delivery: 02/25/19 being seen today for ongoing management of a low-risk pregnancy.  Today she reports no complaints. Contractions: Irregular. Vag. Bleeding: None.  Movement: Absent. denies leaking of fluid. Review of Systems:   Pertinent items are noted in HPI Denies abnormal vaginal discharge w/ itching/odor/irritation, headaches, visual changes, shortness of breath, chest pain, abdominal pain, severe nausea/vomiting, or problems with urination or bowel movements unless otherwise stated above. Pertinent History Reviewed:  Reviewed past medical,surgical, social, obstetrical and family history.  Reviewed problem list, medications and allergies. Physical Assessment:   Vitals:   01/24/19 0856  BP: 124/77  Pulse: 97  Temp: 98 F (36.7 C)  Weight: 163 lb 6.4 oz (74.1 kg)  Body mass index is 29.41 kg/m.        Physical Examination:   General appearance: Well appearing, and in no distress  Mental status: Alert, oriented to person, place, and time  Skin: Warm & dry  Cardiovascular: Normal heart rate noted  Respiratory: Normal respiratory effort, no distress  Abdomen: Soft, gravid, nontender  Pelvic: Cervical exam performed, wet prep obtained by blind swab method        Extremities: Edema: None  Fetal Status: Fetal Heart Rate (bpm): 147   Movement: Absent      Assessment & Plan:  1) Low-risk pregnancy G1P0 at [redacted]w[redacted]d with an Estimated Date of Delivery: 02/25/19   2) Supervision of normal first pregnancy, antepartum  - Cervicovaginal ancillary only( [place]) - Known (+) GBS bacteriuria  3) Macrosomia of fetus affecting management of mother in singleton pregnancy in third trimester - Scheduled for repeat U/S on 02/07/2019   Meds: No orders of the defined types were placed in this encounter.  Labs/procedures today: wet prep  Plan:  Continue routine obstetrical care   Reviewed: Preterm labor symptoms and general obstetric precautions including but not limited to vaginal bleeding, contractions, leaking of fluid and fetal movement were reviewed in detail with the patient.  All questions were answered. Has home bp cuff. Check bp weekly, let us know if >140/90.   Follow-up: Return in about 2 weeks (around 02/07/2019) for Return OB - My Chart video.   Laury Deep MSN, CNM 01/24/2019 9:27 AM

## 2019-01-29 LAB — CERVICOVAGINAL ANCILLARY ONLY
Bacterial Vaginitis (gardnerella): NEGATIVE
Candida Glabrata: NEGATIVE
Candida Vaginitis: NEGATIVE
Chlamydia: NEGATIVE
Comment: NEGATIVE
Comment: NEGATIVE
Comment: NEGATIVE
Comment: NEGATIVE
Comment: NEGATIVE
Comment: NORMAL
Neisseria Gonorrhea: NEGATIVE
Trichomonas: NEGATIVE

## 2019-02-01 ENCOUNTER — Inpatient Hospital Stay (HOSPITAL_COMMUNITY)
Admission: AD | Admit: 2019-02-01 | Discharge: 2019-02-01 | Disposition: A | Discharge status: Home / Self Care | Payer: Medicaid Other | Attending: Obstetrics and Gynecology | Admitting: Obstetrics and Gynecology

## 2019-02-01 ENCOUNTER — Other Ambulatory Visit: Payer: Self-pay

## 2019-02-01 ENCOUNTER — Encounter (HOSPITAL_COMMUNITY): Payer: Self-pay | Admitting: Obstetrics and Gynecology

## 2019-02-01 DIAGNOSIS — Z3A36 36 weeks gestation of pregnancy: Secondary | ICD-10-CM | POA: Insufficient documentation

## 2019-02-01 DIAGNOSIS — B373 Candidiasis of vulva and vagina: Secondary | ICD-10-CM | POA: Diagnosis not present

## 2019-02-01 DIAGNOSIS — O3663X0 Maternal care for excessive fetal growth, third trimester, not applicable or unspecified: Secondary | ICD-10-CM

## 2019-02-01 DIAGNOSIS — O98813 Other maternal infectious and parasitic diseases complicating pregnancy, third trimester: Secondary | ICD-10-CM | POA: Insufficient documentation

## 2019-02-01 DIAGNOSIS — B3731 Acute candidiasis of vulva and vagina: Secondary | ICD-10-CM

## 2019-02-01 DIAGNOSIS — Z7982 Long term (current) use of aspirin: Secondary | ICD-10-CM | POA: Diagnosis not present

## 2019-02-01 DIAGNOSIS — Z79899 Other long term (current) drug therapy: Secondary | ICD-10-CM | POA: Diagnosis not present

## 2019-02-01 DIAGNOSIS — Z3400 Encounter for supervision of normal first pregnancy, unspecified trimester: Secondary | ICD-10-CM

## 2019-02-01 MED ORDER — TERCONAZOLE 0.8 % VA CREA: 1.0000 | TOPICAL_CREAM | Freq: Every day | VAGINAL | 0 refills | Status: AC

## 2019-02-01 NOTE — MAU Provider Note (Addendum)
Chief Complaint:  Vaginal Discharge  First Provider Initiated Contact with Patient 02/01/19 1922      HPI: Shannon Hogan is a 18 y.o. G1P0 at [redacted]w[redacted]d by early ultrasound who presents to maternity admissions reporting pink discharge. Patient reports that she was using the bathroom and after she wiped she had some pinkish discharge on the toilet paper. This event occurred approximately 1 hour prior to presentation at MAU. Patient has never had discharge like this  before. Otherwise, patient endorses hx of yeast infections and notes some vaginal itching.   She reports good fetal movement and denies vaginal bleeding, urinary symptoms, h/a, dizziness, n/v, or fever/chills.     Past Medical History: Past Medical History:  Diagnosis Date  . Medical history non-contributory     Past obstetric history: OB History  Gravida Para Term Preterm AB Living  1            SAB TAB Ectopic Multiple Live Births               # Outcome Date GA Lbr Len/2nd Weight Sex Delivery Anes PTL Lv  1 Current             Past Surgical History: Past Surgical History:  Procedure Laterality Date  . NO PAST SURGERIES      Family History: Family History  Problem Relation Age of Onset  . Stroke Paternal Grandmother   . Glaucoma Paternal Grandmother   . Diabetes Paternal Grandmother   . Diabetes Mother   . Hypertension Father     Social History: Social History   Tobacco Use  . Smoking status: Never Smoker  . Smokeless tobacco: Never Used  Substance Use Topics  . Alcohol use: No  . Drug use: Not Currently    Types: Marijuana    Comment: last used in April 2020    Allergies: No Known Allergies  Meds:  Medications Prior to Admission  Medication Sig Dispense Refill Last Dose  . aspirin EC 81 MG tablet Take 81 mg by mouth daily.   02/01/2019 at Unknown time  . fluconazole (DIFLUCAN) 150 MG tablet Take 1 tablet (150 mg total) by mouth every 3 (three) days. Take one tablet today, on day 4 and day 7. 3  tablet 0 Past Month at Unknown time  . nystatin ointment (MYCOSTATIN) Apply 1 application topically 2 (two) times daily. Apply to external areas only 30 g 0 Past Month at Unknown time  . Prenatal Vit-Fe Phos-FA-Omega (VITAFOL GUMMIES) 3.33-0.333-34.8 MG CHEW Chew 3 each by mouth daily.   02/01/2019 at Unknown time  . terconazole (TERAZOL 7) 0.4 % vaginal cream Place 1 applicator vaginally at bedtime. 45 g 0 Unknown at Unknown time    ROS:  Noted in HPI. Otherwise negative.    I have reviewed patient's Past Medical Hx, Surgical Hx, Family Hx, Social Hx, medications and allergies.   Physical Exam   Patient Vitals for the past 24 hrs:  BP Temp Temp src Pulse Resp SpO2 Height Weight  02/01/19 1859 120/69 99 F (37.2 C) Oral (!) 132 20 99 % -- --  02/01/19 1840 128/69 98.1 F (36.7 C) -- (!) 108 16 99 % 5\' 2"  (1.575 m) 75.5 kg   Constitutional: Well-developed, well-nourished female in no acute distress.  Cardiovascular: normal rate Respiratory: normal effort GI: Abd soft, non-tender, gravid appropriate for gestational age.  MS: Extremities nontender, no edema, normal ROM Neurologic: Alert and oriented x 4.  GU: Mild irritation noted around external genitalia. Small  amount of white, curd-like discharge noted on sterile glove.  SVE: Dilation: Closed Effacement (%): Thick Cervical Position: Posterior Exam by:: Dr. Barrie Folk   NST -baseline: 160 -variability: moderate -accels: present -decels: none -interpretation: reactive   Labs: No results found for this or any previous visit (from the past 24 hour(s)).    Imaging:  none  MAU Course/MDM: -SVE revealed closed/thick/high cervix without SROM - Educated/reassured patient that it is normal to pass mucus in third trimester and that she is not currently in labor, FHR is reassuring, based on self report hard wiping it appears patient may have dislodged mucus during wiping -Terazol suppository-- hx of recurrent yeast  infections/current vaginal itching/observation of irritation noted around external genitalia suggestive of candidiasis   Orders Placed This Encounter  Procedures  . Discharge patient Discharge disposition: 01-Home or Self Care; Discharge patient date: 02/01/2019    Meds ordered this encounter  Medications  . terconazole (TERAZOL 3) 0.8 % vaginal cream    Sig: Place 1 applicator vaginally at bedtime for 3 days.    Dispense:  20 g    Refill:  0      Assessment: Patient is a 18 y.o. G1P0 at [redacted]w[redacted]d by early ultrasound who presented to maternity admissions self-reporting pink discharge who upon SVE confirmed closed/thick/high cervical position consistent with gestational age and no SROM. Patient endorses history of yeast infections and current vaginal itching and believes she may have wiped too hard.   1. [redacted] weeks gestation of pregnancy   2. Macrosomia of fetus affecting management of mother in singleton pregnancy in third trimester   3. Supervision of normal first pregnancy, antepartum   4. Vaginal candidiasis    Plan: 1. [redacted] weeks gestation of pregnancy - Low-risk pregnancy G1P0 at [redacted]w[redacted]d with an Estimated Date of Delivery: 02/25/19  - Reactive NST - Signs and symptoms of labor reviewed  2.Macrosomia of fetus affecting management of mother in singleton pregnancy in third trimester - Scheduled for repeat U/S on 02/07/2019  3. Vaginal candidiasis -Discharge home with Terazol 0.8% vaginal cream - Return precautions given  Hilario Quarry Medical Student 02/01/2019 7:44 PM   GME ATTESTATION:  I saw and evaluated the patient. I agree with the findings and the plan of care as documented in the student's note.  Marlowe Alt, DO OB Fellow, [facility] 02/01/2019 8:19 PM

## 2019-02-01 NOTE — Discharge Instructions (Signed)

## 2019-02-01 NOTE — MAU Note (Signed)
Pt is G1P0 at [redacted]w[redacted]d reporting pink mucous-like discharge about an hour ago. Also reporting round ligament pain. No contractions. +FM.

## 2019-02-04 ENCOUNTER — Encounter: Payer: Self-pay | Admitting: General Practice

## 2019-02-07 ENCOUNTER — Ambulatory Visit (HOSPITAL_COMMUNITY)
Admission: RE | Admit: 2019-02-07 | Discharge: 2019-02-07 | Disposition: A | Discharge status: Home / Self Care | Payer: Medicaid Other | Source: Ambulatory Visit | Attending: Obstetrics and Gynecology | Admitting: Obstetrics and Gynecology

## 2019-02-07 ENCOUNTER — Encounter: Payer: Self-pay | Admitting: Obstetrics and Gynecology

## 2019-02-07 ENCOUNTER — Encounter (HOSPITAL_COMMUNITY): Payer: Self-pay

## 2019-02-07 ENCOUNTER — Other Ambulatory Visit: Payer: Self-pay

## 2019-02-07 ENCOUNTER — Ambulatory Visit (HOSPITAL_COMMUNITY): Payer: Medicaid Other | Admitting: *Deleted

## 2019-02-07 ENCOUNTER — Telehealth (INDEPENDENT_AMBULATORY_CARE_PROVIDER_SITE_OTHER): Payer: Medicaid Other | Admitting: Obstetrics and Gynecology

## 2019-02-07 VITALS — BP 127/78 | HR 97 | Temp 97.8°F | Wt 166.0 lb

## 2019-02-07 DIAGNOSIS — Z3A37 37 weeks gestation of pregnancy: Secondary | ICD-10-CM

## 2019-02-07 DIAGNOSIS — O359XX0 Maternal care for (suspected) fetal abnormality and damage, unspecified, not applicable or unspecified: Secondary | ICD-10-CM | POA: Diagnosis not present

## 2019-02-07 DIAGNOSIS — O3663X0 Maternal care for excessive fetal growth, third trimester, not applicable or unspecified: Secondary | ICD-10-CM

## 2019-02-07 DIAGNOSIS — Z3400 Encounter for supervision of normal first pregnancy, unspecified trimester: Secondary | ICD-10-CM

## 2019-02-07 DIAGNOSIS — Z362 Encounter for other antenatal screening follow-up: Secondary | ICD-10-CM | POA: Diagnosis not present

## 2019-02-07 NOTE — Patient Instructions (Signed)
Your GBS results are POSITIVE. You will need to have antibiotics while in labor. Please seek medical care as soon as your water breaks or you think you are in labor. Please also tell the nurses and delivery providers that you are GBS POSITIVE, so they can make sure you receive the correct antibiotics protocol. As explained to you before, GBS is not harmful to you. The only reason we test for and give antibiotics for it is because the baby can get the bacteria in its body during delivery.    Group B Streptococcus Test During Pregnancy Why am I having this test? Routine testing, also called screening, for group B streptococcus (GBS) is recommended for all pregnant women between the 36th and 37th week of pregnancy. GBS is a type of bacteria that can be passed from mother to baby during childbirth. Screening will help guide whether or not you will need treatment during labor and delivery to prevent complications such as:  An infection in your uterus during labor.  An infection in your uterus after delivery.  A serious infection in your baby after delivery, such as pneumonia, meningitis, or sepsis. GBS screening is not often done before 36 weeks of pregnancy unless you go into labor prematurely. What happens if I have group B streptococcus? If testing shows that you have GBS, your health care provider will recommend treatment with IV antibiotics during labor and delivery. This treatment significantly decreases the risk of complications for you and your baby. If you have a planned C-section and you have GBS, you may not need to be treated with antibiotics because GBS is usually passed to babies after labor starts and your water breaks. If you are in labor or your water breaks before your C-section, it is possible for GBS to get into your uterus and be passed to your baby, so you might need treatment. Is there a chance I may not need to be tested? You may not need to be tested for GBS if:  You have a  urine test that shows GBS before 36 to 37 weeks.  You had a baby with GBS infection after a previous delivery. In these cases, you will automatically be treated for GBS during labor and delivery. What is being tested? This test is done to check if you have group B streptococcus in your vagina or rectum. What kind of sample is taken? To collect samples for this test, your health care provider will swab your vagina and rectum with a cotton swab. The sample is then sent to the lab to see if GBS is present. What happens during the test?   You will remove your clothing from the waist down.  You will lie down on an exam table in the same position as you would for a pelvic exam.  Your health care provider will swab your vagina and rectum to collect samples for a culture test.  You will be able to go home after the test and do all your usual activities. How are the results reported? The test results are reported as positive or negative. What do the results mean?  A positive test means you are at risk for passing GBS to your baby during labor and delivery. Your health care provider will recommend that you are treated with an IV antibiotic during labor and delivery.  A negative test means you are at very low risk of passing GBS to your baby. There is still a low risk of passing GBS to your baby  because sometimes test results may report that you do not have a condition when you do (false-negative result) or there is a chance that you may become infected with GBS after the test is done. You most likely will not need to be treated with an antibiotic during labor and delivery. Talk with your health care provider about what your results mean. Questions to ask your health care provider Ask your health care provider, or the department that is doing the test:  When will my results be ready?  How will I get my results?  What are my treatment options? Summary  Routine testing (screening) for group B  streptococcus (GBS) is recommended for all pregnant women between the 36th and 37th week of pregnancy.  GBS is a type of bacteria that can be passed from mother to baby during childbirth.  If testing shows that you have GBS, your health care provider will recommend that you are treated with IV antibiotics during labor and delivery. This treatment almost always prevents infection in newborns. This information is not intended to replace advice given to you by your health care provider. Make sure you discuss any questions you have with your health care provider. Document Released: 03/07/2018 Document Revised: 05/31/2018 Document Reviewed: 03/07/2018 Elsevier Patient Education  2020 [employer].

## 2019-02-07 NOTE — Progress Notes (Signed)
MY CHART VIDEO VIRTUAL OBSTETRICS VISIT ENCOUNTER NOTE  I connected with Joell Buerger on 02/07/19 at  2:30 PM EST by My Chart video at home and verified that I am speaking with the correct person using two identifiers.   I discussed the limitations, risks, security and privacy concerns of performing an evaluation and management service by My Chart video and the availability of in person appointments. I also discussed with the patient that there may be a patient responsible charge related to this service. The patient expressed understanding and agreed to proceed.  Subjective:  Shannon Hogan is a 18 y.o. G1P0 at [redacted]w[redacted]d being followed for ongoing prenatal care.  She is currently monitored for the following issues for this low-risk pregnancy and has Supervision of normal first pregnancy, antepartum and Macrosomia of fetus affecting management of mother in singleton pregnancy in third trimester on their problem list.  Patient reports increased pelvic pressure. She had U/S today and was told that baby weighs 9 lbs 7 oz today. She is afraid that she will not be able to push a baby that big out. She expressed a desire to go ahead and schedule a C/S. Reports fetal movement. Denies any contractions, bleeding or leaking of fluid.   The following portions of the patient's history were reviewed and updated as appropriate: allergies, current medications, past family history, past medical history, past social history, past surgical history and problem list.   Objective:   General:  Alert, oriented and cooperative.   Mental Status: Normal mood and affect perceived. Normal judgment and thought content.  Rest of physical exam deferred due to type of encounter  BP 127/78   Pulse 97   Temp 97.8 F (36.6 C)   Wt 166 lb (75.3 kg)   LMP 05/06/2018 (Exact Date)   BMI 30.36 kg/m  **Done by patient's own at home BP cuff and scale  Assessment and Plan:  Pregnancy: G1P0 at [redacted]w[redacted]d  1. Supervision of normal  first pregnancy, antepartum - Anticipatory guidance for upcoming labor - Explained that hospital will not allow is to schedule IOL for first time moms before 40 wks, because of the potential extended length of stay and likelihood of c-section with prolonged IOLs - Discussed can have her come in for office visit next week for cervical check and to get IOL scheduled for 40 wks  2. Macrosomia of fetus affecting management of mother in singleton pregnancy in third trimester - Advised that we don't schedule inductions or c-sections for suspected big babies; unless the baby is thought to be over 5000 grams (close to 11 lbs)   Term labor symptoms and general obstetric precautions including but not limited to vaginal bleeding, contractions, leaking of fluid and fetal movement were reviewed in detail with the patient.  I discussed the assessment and treatment plan with the patient. The patient was provided an opportunity to ask questions and all were answered. The patient agreed with the plan and demonstrated an understanding of the instructions. The patient was advised to call back or seek an in-person office evaluation/go to MAU at [facility] for any urgent or concerning symptoms. Please refer to After Visit Summary for other counseling recommendations.   I provided 10 minutes of non-face-to-face time during this encounter. There was 5 minutes of chart review time spent prior to this encounter. Total time spent = 10 minutes.  Return in about 1 week (around 02/14/2019) for  Return OB visit with cervical check and schedule IOL.  No future appointments.  Laury Deep, [place] [facility]

## 2019-02-20 ENCOUNTER — Other Ambulatory Visit: Payer: Self-pay

## 2019-02-20 ENCOUNTER — Encounter: Payer: Self-pay | Admitting: Advanced Practice Midwife

## 2019-02-20 ENCOUNTER — Encounter (HOSPITAL_COMMUNITY): Payer: Self-pay | Admitting: *Deleted

## 2019-02-20 ENCOUNTER — Telehealth (HOSPITAL_COMMUNITY): Payer: Self-pay | Admitting: *Deleted

## 2019-02-20 ENCOUNTER — Ambulatory Visit (INDEPENDENT_AMBULATORY_CARE_PROVIDER_SITE_OTHER): Payer: Medicaid Other | Admitting: Advanced Practice Midwife

## 2019-02-20 VITALS — BP 136/82 | HR 77 | Temp 98.3°F | Wt 169.8 lb

## 2019-02-20 DIAGNOSIS — Z3A39 39 weeks gestation of pregnancy: Secondary | ICD-10-CM

## 2019-02-20 DIAGNOSIS — Z3401 Encounter for supervision of normal first pregnancy, first trimester: Secondary | ICD-10-CM

## 2019-02-20 DIAGNOSIS — Z3403 Encounter for supervision of normal first pregnancy, third trimester: Secondary | ICD-10-CM

## 2019-02-20 MED ORDER — TERCONAZOLE 0.4 % VA CREA: 1.0000 | TOPICAL_CREAM | Freq: Every day | VAGINAL | 0 refills | Status: DC

## 2019-02-20 NOTE — Telephone Encounter (Signed)
Preadmission screen  

## 2019-02-20 NOTE — Progress Notes (Signed)
   PRENATAL VISIT NOTE  Subjective:  Shannon Hogan is a 18 y.o. G1P0 at [redacted]w[redacted]d being seen today for ongoing prenatal care.  She is currently monitored for the following issues for this low-risk pregnancy and has Supervision of normal first pregnancy, antepartum and Macrosomia of fetus affecting management of mother in singleton pregnancy in third trimester on their problem list.  Patient reports no complaints.  Contractions: Irregular. Vag. Bleeding: None.  Movement: Present. Denies leaking of fluid.   The following portions of the patient's history were reviewed and updated as appropriate: allergies, current medications, past family history, past medical history, past social history, past surgical history and problem list.   Objective:   Vitals:   02/20/19 0912  BP: 136/82  Pulse: 77  Temp: 98.3 F (36.8 C)  Weight: 169 lb 12.8 oz (77 kg)    Fetal Status: Fetal Heart Rate (bpm): 147 Fundal Height: 42 cm Movement: Present  Presentation: Vertex  General:  Alert, oriented and cooperative. Patient is in no acute distress.  Skin: Skin is warm and dry. No rash noted.   Cardiovascular: Normal heart rate noted  Respiratory: Normal respiratory effort, no problems with respiration noted  Abdomen: Soft, gravid, appropriate for gestational age.  Pain/Pressure: Present     Pelvic: Cervical exam performed Dilation: Closed Effacement (%): Thick Station: -2  Extremities: Normal range of motion.  Edema: Mild pitting, slight indentation  Mental Status: Normal mood and affect. Normal behavior. Normal judgment and thought content.   Assessment and Plan:  Pregnancy: G1P0 at [redacted]w[redacted]d 1. Encounter for supervision of normal first pregnancy in first trimester - 131/97 at home on 02/18/2019, but generally 130/80 at home.  - Orders placed - Will have patient monitor BP at home this week, and BP check in the office on Monday. Pre-eclampsia warning signs reviewed - BPP/NST next week - IOL scheduled, orders  placed   Preterm labor symptoms and general obstetric precautions including but not limited to vaginal bleeding, contractions, leaking of fluid and fetal movement were reviewed in detail with the patient. Please refer to After Visit Summary for other counseling recommendations.   Return in about 1 week (around 02/27/2019) for Post dates BPP/NST .  Future Appointments  Date Time Provider [place]  02/25/2019  8:50 AM [facility] NURSE [facility] None  02/27/2019  9:15 AM [facility] NST [facility] WOC  03/01/2019  7:30 AM [facility] [place] [facility] None    Marcille Buffy DNP, CNM  02/20/19  9:54 AM

## 2019-02-22 ENCOUNTER — Encounter (HOSPITAL_COMMUNITY): Payer: Self-pay | Admitting: Obstetrics and Gynecology

## 2019-02-22 ENCOUNTER — Inpatient Hospital Stay (HOSPITAL_COMMUNITY)
Admission: AD | Admit: 2019-02-22 | Discharge: 2019-02-26 | DRG: 786 | Disposition: A | Discharge status: Home / Self Care | Payer: Medicaid Other | Attending: Obstetrics and Gynecology | Admitting: Obstetrics and Gynecology

## 2019-02-22 ENCOUNTER — Other Ambulatory Visit: Payer: Self-pay

## 2019-02-22 ENCOUNTER — Other Ambulatory Visit: Payer: Self-pay | Admitting: Advanced Practice Midwife

## 2019-02-22 DIAGNOSIS — O480 Post-term pregnancy: Secondary | ICD-10-CM | POA: Diagnosis not present

## 2019-02-22 DIAGNOSIS — D62 Acute posthemorrhagic anemia: Secondary | ICD-10-CM | POA: Diagnosis not present

## 2019-02-22 DIAGNOSIS — Z3A00 Weeks of gestation of pregnancy not specified: Secondary | ICD-10-CM | POA: Diagnosis not present

## 2019-02-22 DIAGNOSIS — O99824 Streptococcus B carrier state complicating childbirth: Secondary | ICD-10-CM | POA: Diagnosis not present

## 2019-02-22 DIAGNOSIS — Z3A39 39 weeks gestation of pregnancy: Secondary | ICD-10-CM | POA: Diagnosis not present

## 2019-02-22 DIAGNOSIS — O4593 Premature separation of placenta, unspecified, third trimester: Secondary | ICD-10-CM | POA: Diagnosis not present

## 2019-02-22 DIAGNOSIS — O134 Gestational [pregnancy-induced] hypertension without significant proteinuria, complicating childbirth: Secondary | ICD-10-CM | POA: Diagnosis not present

## 2019-02-22 DIAGNOSIS — Z3400 Encounter for supervision of normal first pregnancy, unspecified trimester: Secondary | ICD-10-CM

## 2019-02-22 DIAGNOSIS — O133 Gestational [pregnancy-induced] hypertension without significant proteinuria, third trimester: Secondary | ICD-10-CM

## 2019-02-22 DIAGNOSIS — O4590 Premature separation of placenta, unspecified, unspecified trimester: Secondary | ICD-10-CM

## 2019-02-22 DIAGNOSIS — O9081 Anemia of the puerperium: Secondary | ICD-10-CM | POA: Diagnosis not present

## 2019-02-22 DIAGNOSIS — O3663X0 Maternal care for excessive fetal growth, third trimester, not applicable or unspecified: Secondary | ICD-10-CM | POA: Diagnosis not present

## 2019-02-22 DIAGNOSIS — O139 Gestational [pregnancy-induced] hypertension without significant proteinuria, unspecified trimester: Secondary | ICD-10-CM

## 2019-02-22 DIAGNOSIS — Z20822 Contact with and (suspected) exposure to covid-19: Secondary | ICD-10-CM | POA: Diagnosis present

## 2019-02-22 DIAGNOSIS — O99214 Obesity complicating childbirth: Secondary | ICD-10-CM | POA: Diagnosis present

## 2019-02-22 DIAGNOSIS — E669 Obesity, unspecified: Secondary | ICD-10-CM | POA: Diagnosis present

## 2019-02-22 DIAGNOSIS — O411290 Chorioamnionitis, unspecified trimester, not applicable or unspecified: Secondary | ICD-10-CM | POA: Diagnosis not present

## 2019-02-22 LAB — URINALYSIS, ROUTINE W REFLEX MICROSCOPIC
Bilirubin Urine: NEGATIVE
Glucose, UA: NEGATIVE mg/dL
Hgb urine dipstick: NEGATIVE
Ketones, ur: NEGATIVE mg/dL
Nitrite: NEGATIVE
Protein, ur: NEGATIVE mg/dL
Specific Gravity, Urine: 1.012 (ref 1.005–1.030)
pH: 7 (ref 5.0–8.0)

## 2019-02-22 LAB — ABO/RH: ABO/RH(D): A POS

## 2019-02-22 LAB — CBC
HCT: 29.2 % — ABNORMAL LOW (ref 36.0–46.0)
Hemoglobin: 8.9 g/dL — ABNORMAL LOW (ref 12.0–15.0)
MCH: 22.9 pg — ABNORMAL LOW (ref 26.0–34.0)
MCHC: 30.5 g/dL (ref 30.0–36.0)
MCV: 75.3 fL — ABNORMAL LOW (ref 80.0–100.0)
Platelets: 222 10*3/uL (ref 150–400)
RBC: 3.88 MIL/uL (ref 3.87–5.11)
RDW: 16.8 % — ABNORMAL HIGH (ref 11.5–15.5)
WBC: 8.7 10*3/uL (ref 4.0–10.5)
nRBC: 0 % (ref 0.0–0.2)

## 2019-02-22 LAB — COMPREHENSIVE METABOLIC PANEL
ALT: 18 U/L (ref 0–44)
AST: 25 U/L (ref 15–41)
Albumin: 2.9 g/dL — ABNORMAL LOW (ref 3.5–5.0)
Alkaline Phosphatase: 174 U/L — ABNORMAL HIGH (ref 38–126)
Anion gap: 12 (ref 5–15)
BUN: 14 mg/dL (ref 6–20)
CO2: 18 mmol/L — ABNORMAL LOW (ref 22–32)
Calcium: 9.9 mg/dL (ref 8.9–10.3)
Chloride: 108 mmol/L (ref 98–111)
Creatinine, Ser: 0.63 mg/dL (ref 0.44–1.00)
GFR calc Af Amer: >60 mL/min (ref >60)
GFR calc non Af Amer: >60 mL/min (ref >60)
Glucose, Bld: 95 mg/dL (ref 70–99)
Potassium: 3.8 mmol/L (ref 3.5–5.1)
Sodium: 138 mmol/L (ref 135–145)
Total Bilirubin: 0.5 mg/dL (ref 0.3–1.2)
Total Protein: 6.5 g/dL (ref 6.5–8.1)

## 2019-02-22 LAB — PROTEIN / CREATININE RATIO, URINE
Creatinine, Urine: 84.48 mg/dL
Protein Creatinine Ratio: 0.12 mg/mg{Cre} (ref 0.00–0.15)
Total Protein, Urine: 10 mg/dL

## 2019-02-22 LAB — TYPE AND SCREEN
ABO/RH(D): A POS
Antibody Screen: NEGATIVE

## 2019-02-22 LAB — SARS CORONAVIRUS 2 (TAT 6-24 HRS): SARS Coronavirus 2: NEGATIVE

## 2019-02-22 MED ORDER — TERBUTALINE SULFATE 1 MG/ML IJ SOLN
0.2500 mg | Freq: Once | INTRAMUSCULAR | Status: DC | PRN
  Filled 2019-02-22: qty 1

## 2019-02-22 MED ORDER — LACTATED RINGERS IV SOLN: INTRAVENOUS | Status: DC

## 2019-02-22 MED ORDER — OXYCODONE-ACETAMINOPHEN 5-325 MG PO TABS: 2.0000 | ORAL_TABLET | ORAL | Status: DC | PRN

## 2019-02-22 MED ORDER — ACETAMINOPHEN 325 MG PO TABS: 650.0000 mg | ORAL_TABLET | ORAL | Status: DC | PRN

## 2019-02-22 MED ORDER — LIDOCAINE HCL (PF) 1 % IJ SOLN: 30.0000 mL | INTRAMUSCULAR | Status: DC | PRN

## 2019-02-22 MED ORDER — ONDANSETRON HCL 4 MG/2ML IJ SOLN: 4.0000 mg | Freq: Four times a day (QID) | INTRAMUSCULAR | Status: DC | PRN

## 2019-02-22 MED ORDER — MISOPROSTOL 50MCG HALF TABLET
50.0000 ug | ORAL_TABLET | ORAL | Status: DC | PRN
  Administered 2019-02-22 – 2019-02-23 (×4): 50 ug via BUCCAL
  Filled 2019-02-22 (×4): qty 1

## 2019-02-22 MED ORDER — SOD CITRATE-CITRIC ACID 500-334 MG/5ML PO SOLN
30.0000 mL | ORAL | Status: DC | PRN
  Administered 2019-02-24: 09:00:00 30 mL via ORAL
  Filled 2019-02-22: qty 30

## 2019-02-22 MED ORDER — SODIUM CHLORIDE 0.9 % IV SOLN: 5.0000 10*6.[IU] | Freq: Once | INTRAVENOUS | Status: DC

## 2019-02-22 MED ORDER — PENICILLIN G POT IN DEXTROSE 60000 UNIT/ML IV SOLN: 3.0000 10*6.[IU] | INTRAVENOUS | Status: DC

## 2019-02-22 MED ORDER — OXYCODONE-ACETAMINOPHEN 5-325 MG PO TABS: 1.0000 | ORAL_TABLET | ORAL | Status: DC | PRN

## 2019-02-22 MED ORDER — SODIUM CHLORIDE 0.9 % IV SOLN
5.0000 10*6.[IU] | Freq: Once | INTRAVENOUS | Status: AC
  Administered 2019-02-22: 5 10*6.[IU] via INTRAVENOUS
  Filled 2019-02-22: qty 5

## 2019-02-22 MED ORDER — LACTATED RINGERS IV SOLN
500.0000 mL | INTRAVENOUS | Status: DC | PRN
  Administered 2019-02-24: 500 mL via INTRAVENOUS

## 2019-02-22 MED ORDER — PENICILLIN G POT IN DEXTROSE 60000 UNIT/ML IV SOLN
3.0000 10*6.[IU] | INTRAVENOUS | Status: DC
  Administered 2019-02-22 – 2019-02-24 (×10): 3 10*6.[IU] via INTRAVENOUS
  Filled 2019-02-22 (×10): qty 50

## 2019-02-22 MED ORDER — OXYTOCIN 40 UNITS IN NORMAL SALINE INFUSION - SIMPLE MED
1.0000 m[IU]/min | INTRAVENOUS | Status: DC
  Administered 2019-02-23 – 2019-02-24 (×2): 2 m[IU]/min via INTRAVENOUS
  Filled 2019-02-22: qty 1000

## 2019-02-22 MED ORDER — OXYTOCIN BOLUS FROM INFUSION: 500.0000 mL | Freq: Once | INTRAVENOUS | Status: DC

## 2019-02-22 MED ORDER — OXYTOCIN 40 UNITS IN NORMAL SALINE INFUSION - SIMPLE MED: 2.5000 [IU]/h | INTRAVENOUS | Status: DC

## 2019-02-22 MED ORDER — ZOLPIDEM TARTRATE 5 MG PO TABS
5.0000 mg | ORAL_TABLET | Freq: Every evening | ORAL | Status: DC | PRN
  Administered 2019-02-22: 5 mg via ORAL
  Filled 2019-02-22: qty 1

## 2019-02-22 NOTE — MAU Note (Signed)
Shannon Hogan is a 19 y.o. at [redacted]w[redacted]d here in MAU reporting: that she was told to come in and be evaluated if her B/P was high. B/P at home was elevated today. Denies any pain  Onset of complaint: today Pain score: 0 Vitals:   02/22/19 1306 02/22/19 1307  BP:  (!) 146/83  Pulse:  (!) 101  Resp:  18  Temp:  98.5 F (36.9 C)  SpO2: 99%      FHT:145 Lab orders placed from triage: UA

## 2019-02-22 NOTE — MAU Provider Note (Addendum)
History   [id number]   Chief Complaint  Patient presents with  . Hypertension    HPI Shannon Hogan is a 19 y.o. female  G1P0 @39 .4 wks here with report of elevated BP at home 150/99. She denies vaginal bleeding, ctx, and LOF. She reports + fetal movement. Denies HA, visual disturbances, RUQ pain, SOB, and CP. All other systems negative.    Patient's last menstrual period was 05/06/2018 (exact date).  OB History  Gravida Para Term Preterm AB Living  1            SAB TAB Ectopic Multiple Live Births               # Outcome Date GA Lbr Len/2nd Weight Sex Delivery Anes PTL Lv  1 Current             Past Medical History:  Diagnosis Date  . Medical history non-contributory   . Pregnancy induced hypertension     Family History  Problem Relation Age of Onset  . Stroke Paternal Grandmother   . Glaucoma Paternal Grandmother   . Diabetes Paternal Grandmother   . Diabetes Mother   . Hypertension Father     Social History   Socioeconomic History  . Marital status: Single    Spouse name: Not on file  . Number of children: Not on file  . Years of education: Not on file  . Highest education level: Not on file  Occupational History  . Not on file  Tobacco Use  . Smoking status: Never Smoker  . Smokeless tobacco: Never Used  Substance and Sexual Activity  . Alcohol use: No  . Drug use: Not Currently    Types: Marijuana    Comment: last used in April 2020  . Sexual activity: Yes  Other Topics Concern  . Not on file  Social History Narrative  . Not on file   Social Determinants of Health   Financial Resource Strain: Low Risk   . Difficulty of Paying Living Expenses: Not hard at all  Food Insecurity:   . Worried About Charity fundraiser in the Last Year: Not on file  . Ran Out of Food in the Last Year: Not on file  Transportation Needs: No Transportation Needs  . Lack of Transportation (Medical): No  . Lack of Transportation (Non-Medical): No  Physical  Activity:   . Days of Exercise per Week: Not on file  . Minutes of Exercise per Session: Not on file  Stress:   . Feeling of Stress : Not on file  Social Connections:   . Frequency of Communication with Friends and Family: Not on file  . Frequency of Social Gatherings with Friends and Family: Not on file  . Attends Religious Services: Not on file  . Active Member of Clubs or Organizations: Not on file  . Attends Archivist Meetings: Not on file  . Marital Status: Not on file    No Known Allergies  No current facility-administered medications on file prior to encounter.   Current Outpatient Medications on File Prior to Encounter  Medication Sig Dispense Refill  . aspirin EC 81 MG tablet Take 81 mg by mouth daily.    Marland Kitchen nystatin ointment (MYCOSTATIN) Apply 1 application topically 2 (two) times daily. Apply to external areas only 30 g 0  . Prenatal Vit-Fe Phos-FA-Omega (VITAFOL GUMMIES) 3.33-0.333-34.8 MG CHEW Chew 3 each by mouth daily.    Marland Kitchen terconazole (TERAZOL 7) 0.4 % vaginal cream Place 1  applicator vaginally at bedtime for 7 days. 45 g 0   Review of Systems  Eyes: Negative for visual disturbance.  Gastrointestinal: Negative for abdominal pain.  Genitourinary: Negative for vaginal bleeding and vaginal discharge.  Neurological: Negative for headaches.   Physical Exam   Patient Vitals for the past 24 hrs:  BP Temp Pulse Resp SpO2  02/22/19 1338 - - (!) 106 - -  02/22/19 1330 129/90 - (!) 125 - 99 %  02/22/19 1325 134/88 - (!) 129 - 99 %  02/22/19 1307 (!) 146/83 98.5 F (36.9 C) (!) 101 18 -  02/22/19 1306 - - - - 99 %   Physical Exam  Nursing note and vitals reviewed. Constitutional: She is oriented to person, place, and time. She appears well-developed and well-nourished. No distress.  HENT:  Head: Normocephalic and atraumatic.  Cardiovascular: Normal rate.  Respiratory: Effort normal. No respiratory distress.  GI: Soft. There is no abdominal tenderness.   Genitourinary:    Genitourinary Comments: VE: closed/thick   Musculoskeletal:        General: Normal range of motion.     Cervical back: Normal range of motion.  Neurological: She is alert and oriented to person, place, and time.  Skin: Skin is warm and dry.  Psychiatric: She has a normal mood and affect.  EFM: 145 bpm, mod variability, no accels, variable decel x1 Toco: 3-6, mild Bedside US: vtx  MAU Course  Procedures  MDM Meets criteria for gHTN with HTN noted on 02/18/19. No severe features. Plan for admit and IOL.   Assessment and Plan  [redacted] weeks gestation gHTN Admit to LD Mngt per labor team Philipp Deputy, CNM notified  Donette Larry, CNM 02/22/2019 1:50 PM

## 2019-02-22 NOTE — H&P (Signed)
Shannon Hogan is a 19 y.o. female G1 at 39.4wks by 7wk scan presenting for IOL due to gHTN. Came to MAU 2/2 elevated BP at home- in MAU had pressures 129-146/80-90. Denies H/A, visual disturbances, and RUQ pain. No leaking, bldg, or reg ctx.  Her preg has been followed by the [facility] service and has been remarkable for:  # elevated BP on 12/28 at home 131/97 # fetal macrosomia since 29wks (most recent @ 37wks 9+7) # fetal bilat pyelectasis- resolved # GBS bacteriuria from July 2020 (lab in media tab)  OB History    Gravida  1   Para      Term      Preterm      AB      Living        SAB      TAB      Ectopic      Multiple      Live Births             Past Medical History:  Diagnosis Date  . Medical history non-contributory   . Pregnancy induced hypertension    Past Surgical History:  Procedure Laterality Date  . NO PAST SURGERIES     Family History: family history includes Diabetes in her mother and paternal grandmother; Glaucoma in her paternal grandmother; Hypertension in her father; Stroke in her paternal grandmother. Social History:  reports that she has never smoked. She has never used smokeless tobacco. She reports previous drug use. Drug: Marijuana. She reports that she does not drink alcohol.     Maternal Diabetes: No Genetic Screening: Normal Maternal Ultrasounds/Referrals: Other: fetal macrosomia 4292gm @ 37wks; bilat pyelectasis- resolved Fetal Ultrasounds or other Referrals:  None Maternal Substance Abuse:  No Significant Maternal Medications:  None Significant Maternal Lab Results:  Group B Strep positive Other Comments:  None  Review of Systems History Dilation: Fingertip Effacement (%): Thick Station: -2 Exam by:: Ignacia Felling, RN Blood pressure 140/89, pulse 95, temperature 99.6 F (37.6 C), temperature source Oral, resp. rate 16, height 5\' 2"  (1.575 m), weight 77 kg, last menstrual period 05/06/2018, SpO2 99  %. Exam Physical Exam  Constitutional: She is oriented to person, place, and time. She appears well-developed.  HENT:  Head: Normocephalic.  Cardiovascular: Normal rate.  Respiratory: Effort normal.  GI:  EFW 145-150, +accels, no decels Ctx irreg, mild  Musculoskeletal:        General: Normal range of motion.     Cervical back: Normal range of motion.  Neurological: She is alert and oriented to person, place, and time.  Skin: Skin is warm and dry.  Psychiatric: She has a normal mood and affect. Her behavior is normal. Thought content normal.    Prenatal labs: ABO, Rh: --/--/A POS, A POS Performed at [facility], [address] [place], [place] [address]  [phone]) Antibody: NEG (01/01 1347) Rubella:   RPR: Non Reactive (10/16 0816)  HBsAg:    HIV: Non Reactive (10/16 0816)  GBS:  + in urine (08/30/2018)  CBC    Component Value Date/Time   WBC 8.7 02/22/2019 1358   RBC 3.88 02/22/2019 1358   HGB 8.9 (L) 02/22/2019 1358   HGB 9.7 (L) 12/07/2018 0823   HCT 29.2 (L) 02/22/2019 1358   HCT 30.5 (L) 12/07/2018 0823   PLT 222 02/22/2019 1358   PLT 227 12/07/2018 0823   MCV 75.3 (L) 02/22/2019 1358   MCV 82 12/07/2018 0823   MCH 22.9 (  L) 02/22/2019 1358   MCHC 30.5 02/22/2019 1358   RDW 16.8 (H) 02/22/2019 1358   RDW 12.6 12/07/2018 0823   LYMPHSABS 1.8 09/07/2016 1715   MONOABS 1.2 09/07/2016 1715   EOSABS 0.0 09/07/2016 1715   BASOSABS 0.0 09/07/2016 1715   CMP     Component Value Date/Time   NA 138 02/22/2019 1358   K 3.8 02/22/2019 1358   CL 108 02/22/2019 1358   CO2 18 (L) 02/22/2019 1358   GLUCOSE 95 02/22/2019 1358   BUN 14 02/22/2019 1358   CREATININE 0.63 02/22/2019 1358   CALCIUM 9.9 02/22/2019 1358   PROT 6.5 02/22/2019 1358   ALBUMIN 2.9 (L) 02/22/2019 1358   AST 25 02/22/2019 1358   ALT 18 02/22/2019 1358   ALKPHOS 174 (H) 02/22/2019 1358   BILITOT 0.5 02/22/2019 1358   GFRNONAA >60 02/22/2019 1358   GFRAA >60 02/22/2019 1358   Urine  P/C: 0.12  Assessment/Plan: IUP@39 .4wks gHTN- neg pre-e labs GBS pos Macrosomia Cx unfavorable  Admit to Labor and Delivery Plan cx ripening with cytotec, cervical foley, Pit and AROM prn- rev'd w/ pt PCN for GBS ppx BPs in elevated range but not severe range; will continue to watch for s/s of development of pre-e   Arabella Merles CNM 02/22/2019, 3:16 PM

## 2019-02-22 NOTE — Progress Notes (Signed)
Patient ID: Shannon Hogan, female   DOB: 03-25-2000, 19 y.o.   MRN: [id number] Doing well  Tired of the bed, wants a shower  Vitals:   02/22/19 1647 02/22/19 1750 02/22/19 1916 02/22/19 2052  BP: 129/77 127/75 130/69 123/67  Pulse: 86 86 85 79  Resp: 16 14 20 17   Temp:   (!) 97.5 F (36.4 C)   TempSrc:   Oral   SpO2:      Weight:      Height:       FHR reactive with average variability, +accels UCs every 1-2 min  Cervix exam deferred

## 2019-02-22 NOTE — Progress Notes (Addendum)
Patient ID: Shannon Hogan, female   DOB: 10/23/2000, 19 y.o.   MRN: [id number]  S/p cytotec x 2 doses now; feels well but uncomfortable in bed  BPs 130/69, 127/75 FHR 130s, +accels, no decels, Cat 1 Ctx irreg q 1-5 mins, mild Cx FT/thick/vtx -2 per RN with 2nd cytotec  IUP@39 .4wks gHTN Macrosomia Cx unfavorable  Continue with cervical ripening Anticipate vag del  Shannon Hogan [facility] 02/22/2019

## 2019-02-23 ENCOUNTER — Inpatient Hospital Stay (HOSPITAL_COMMUNITY): Payer: Medicaid Other | Admitting: Anesthesiology

## 2019-02-23 LAB — CBC
HCT: 29.4 % — ABNORMAL LOW (ref 36.0–46.0)
Hemoglobin: 9.4 g/dL — ABNORMAL LOW (ref 12.0–15.0)
MCH: 23 pg — ABNORMAL LOW (ref 26.0–34.0)
MCHC: 32 g/dL (ref 30.0–36.0)
MCV: 72.1 fL — ABNORMAL LOW (ref 80.0–100.0)
Platelets: 214 10*3/uL (ref 150–400)
RBC: 4.08 MIL/uL (ref 3.87–5.11)
RDW: 16.6 % — ABNORMAL HIGH (ref 11.5–15.5)
WBC: 12.2 10*3/uL — ABNORMAL HIGH (ref 4.0–10.5)
nRBC: 0 % (ref 0.0–0.2)

## 2019-02-23 LAB — RPR: RPR Ser Ql: NONREACTIVE

## 2019-02-23 MED ORDER — LIDOCAINE HCL (PF) 1 % IJ SOLN
INTRAMUSCULAR | Status: DC | PRN
  Administered 2019-02-23: 5 mL via EPIDURAL
  Administered 2019-02-23: 2 mL via EPIDURAL
  Administered 2019-02-23: 3 mL via EPIDURAL

## 2019-02-23 MED ORDER — PHENYLEPHRINE 40 MCG/ML (10ML) SYRINGE FOR IV PUSH (FOR BLOOD PRESSURE SUPPORT): 80.0000 ug | PREFILLED_SYRINGE | INTRAVENOUS | Status: DC | PRN

## 2019-02-23 MED ORDER — EPHEDRINE 5 MG/ML INJ: 10.0000 mg | INTRAVENOUS | Status: DC | PRN

## 2019-02-23 MED ORDER — FENTANYL-BUPIVACAINE-NACL 0.5-0.125-0.9 MG/250ML-% EP SOLN
12.0000 mL/h | EPIDURAL | Status: DC | PRN
  Filled 2019-02-23: qty 250

## 2019-02-23 MED ORDER — FENTANYL CITRATE (PF) 100 MCG/2ML IJ SOLN
100.0000 ug | INTRAMUSCULAR | Status: DC | PRN
  Administered 2019-02-23 (×3): 100 ug via INTRAVENOUS
  Administered 2019-02-24 (×4): 25 ug via INTRAVENOUS
  Filled 2019-02-23 (×2): qty 2

## 2019-02-23 MED ORDER — LACTATED RINGERS IV SOLN: 500.0000 mL | Freq: Once | INTRAVENOUS | Status: DC

## 2019-02-23 MED ORDER — FENTANYL CITRATE (PF) 100 MCG/2ML IJ SOLN
INTRAMUSCULAR | Status: AC
  Filled 2019-02-23: qty 2

## 2019-02-23 MED ORDER — PHENYLEPHRINE 40 MCG/ML (10ML) SYRINGE FOR IV PUSH (FOR BLOOD PRESSURE SUPPORT)
80.0000 ug | PREFILLED_SYRINGE | INTRAVENOUS | Status: DC | PRN
  Administered 2019-02-24: 100 ug via INTRAVENOUS
  Filled 2019-02-23: qty 10

## 2019-02-23 MED ORDER — DIPHENHYDRAMINE HCL 50 MG/ML IJ SOLN: 12.5000 mg | INTRAMUSCULAR | Status: DC | PRN

## 2019-02-23 MED ORDER — LACTATED RINGERS IV SOLN
500.0000 mL | Freq: Once | INTRAVENOUS | Status: AC
  Administered 2019-02-23: 500 mL via INTRAVENOUS

## 2019-02-23 MED ORDER — SODIUM CHLORIDE (PF) 0.9 % IJ SOLN
INTRAMUSCULAR | Status: DC | PRN
  Administered 2019-02-23: 12 mL/h via EPIDURAL

## 2019-02-23 MED ORDER — LACTATED RINGERS AMNIOINFUSION: INTRAVENOUS | Status: DC

## 2019-02-23 NOTE — Progress Notes (Signed)
Patient ID: Shannon Hogan, female   DOB: Jun 07, 2000, 19 y.o.   MRN: [id number] Vitals:   02/22/19 1916 02/22/19 2052 02/22/19 2210 02/23/19 0108  BP: 130/69 123/67 126/78 118/72  Pulse: 85 79 92 74  Resp: 20 17  16   Temp: (!) 97.5 F (36.4 C)   98.9 F (37.2 C)  TempSrc: Oral   Oral  SpO2:      Weight:      Height:       Fetal heart rate tracing reactive Baseline 145 +accels No decels UCs irregular  Dilation: 1 Effacement (%): 20 Cervical Position: Posterior Station: -2 Presentation: Vertex Exam by:: D Barnicle RN  Will continue plan of care Consider foley soon

## 2019-02-23 NOTE — Progress Notes (Signed)
   Shannon Hogan is a 19 y.o. G1P0 at [redacted]w[redacted]d  admitted for induction of labor due to [facility]. Her IOL started at 11 pm last night.   Subjective: Resting comfortably.   Objective: Vitals:   02/23/19 1930 02/23/19 2000 02/23/19 2030 02/23/19 2131  BP: 122/74 131/84 120/71 118/74  Pulse: 81 99 88 99  Resp:      Temp:      TempSrc:      SpO2:    97%  Weight:      Height:       Total I/O In: -  Out: 825 [Urine:825]  FHT:  FHR: 150 bpm, variability: moderate,  accelerations:  Present,  decelerations:  Present variables  UC:   regular, every 2-3 minutes SVE:   Dilation: 4 Effacement (%): 90 Station: -1 Exam by:: Alverda Skeans, CNM Pitocin @ 12  mu/min  Labs: Lab Results  Component Value Date   WBC 12.2 (H) 02/23/2019   HGB 9.4 (L) 02/23/2019   HCT 29.4 (L) 02/23/2019   MCV 72.1 (L) 02/23/2019   PLT 214 02/23/2019    Assessment / Plan: Patient has been repositioned after amnioinfusion started at 9:30. FSE was placed by me at 1015 pm. Between 9:30 and 1015 pm patient was repositioned on right lateral and hands and kneeds due to variables. Now, after FSE placement, patient is resting supine. Will continue to monitor closely.   Labor: early labor.  Fetal Wellbeing:  Category III; continue to monitor closely, Pain Control:  Epidural Anticipated MOD:  NSVD  Charlesetta Garibaldi [facility] 02/23/2019, 10:21 PM

## 2019-02-23 NOTE — Progress Notes (Signed)
Shannon Hogan is a 19 y.o. G1P0 at [redacted]w[redacted]d admitted for  IOL 2/2 gHTN  Subjective: Contractions spaced out but painful  Objective: BP 134/84   Pulse 80   Temp 98 F (36.7 C) (Oral)   Resp 18   Ht 5\' 2"  (1.575 m)   Wt 77 kg   LMP 05/06/2018 (Exact Date)   SpO2 99%   BMI 31.06 kg/m  No intake/output data recorded.  FHT:  FHR: 140 bpm, variability: moderate,  accelerations:  Present,  decelerations:  Absent UC:   irregular, every 10-15 minutes  SVE:   Dilation: 4.5 Effacement (%): 60 Station: -2 Exam by:: Sparicino MD  Labs: Lab Results  Component Value Date   WBC 8.7 02/22/2019   HGB 8.9 (L) 02/22/2019   HCT 29.2 (L) 02/22/2019   MCV 75.3 (L) 02/22/2019   PLT 222 02/22/2019    Assessment / Plan: 19 yo G1P0 at 39.5 here for IOL 2/2 gHTN.  Labor: s/p FB, cyto x2. Cervix still 60%, so will give more cytotec. Patient does not tolerate cervical exams well. Fetal Wellbeing:  Category I and Category II Pain Control:  per patient request GHTN: most recent BPs normotensive. Pre-E labs normal I/D:  Positive in urine, PCN Anticipated MOD:  vaginal, CS as appropriate  26 DO OB Fellow, Faculty Practice 02/23/2019, 9:52 AM

## 2019-02-23 NOTE — Anesthesia Procedure Notes (Signed)
Epidural Patient location during procedure: OB Start time: 02/23/2019 1:55 PM End time: 02/23/2019 2:02 PM  Staffing Anesthesiologist: Cecile Hearing, MD Performed: anesthesiologist   Preanesthetic Checklist Completed: patient identified, IV checked, risks and benefits discussed, monitors and equipment checked, pre-op evaluation and timeout performed  Epidural Patient position: sitting Prep: DuraPrep Patient monitoring: blood pressure and continuous pulse ox Approach: midline Location: L3-L4 Injection technique: LOR air  Needle:  Needle type: Tuohy  Needle gauge: 17 G Needle length: 9 cm Needle insertion depth: 5 cm Catheter size: 19 Gauge Catheter at skin depth: 10 cm Test dose: negative and Other (1% Lidocaine)  Additional Notes Patient identified.  Risk benefits discussed including failed block, incomplete pain control, headache, nerve damage, paralysis, blood pressure changes, nausea, vomiting, reactions to medication both toxic or allergic, and postpartum back pain.  Patient expressed understanding and wished to proceed.  All questions were answered.  Sterile technique used throughout procedure and epidural site dressed with sterile barrier dressing. No paresthesia or other complications noted. The patient did not experience any signs of intravascular injection such as tinnitus or metallic taste in mouth nor signs of intrathecal spread such as rapid motor block. Please see nursing notes for vital signs. Reason for block:procedure for pain

## 2019-02-23 NOTE — Progress Notes (Addendum)
OB/GYN Faculty Practice: Labor Progress Note  Subjective:  Patient doing well.   Objective:  BP 131/84   Pulse 99   Temp 98.7 F (37.1 C) (Oral)   Resp 18   Ht 5\' 2"  (1.575 m)   Wt 77 kg   LMP 05/06/2018 (Exact Date)   SpO2 99%   BMI 31.06 kg/m  Gen: Lying in bed comfortably. NAD.  Extremities: No signs of DVT.   CE: Dilation: 4.5 Effacement (%): 90 Cervical Position: Middle Station: -2 Presentation: Vertex Exam by:: [email or web address], RN Contractions: regular q48minutes FH: BL 150, + var, + a, -d.   Assessment and Plan:  Shannon Hogan is a 19 y.o. G1P0 at [redacted]w[redacted]d - IOL of g HTN (negative labs) with pregnancy complicated by anemia and gHTN here for IOL for gHTN.   Labor:  Arrived 02/22/19 at 1PM  GBS +, PCN  S/p Cytotec x 4 + FB, AROM  Currently Pitocin 12 mu/min (02/23/19 @ 1500)  Pain control: Epidural Anticipated MOD: NSVD  PPH Risk: LGA, augmentation, maternal anemia, maternal obesity, prime  Fetal Wellbeing: Cat I tracing GBS positive  Continuous fetal monitoring Placed IUPC at 2100 and amniofusion with 04/23/19 bolus and 100 mL/hr for late decels when trying to adjust mom's position.   , M.D.  Family Medicine  PGY-2 02/23/2019 8:28 PM  I confirm that I have verified the information documented in the resident's note and that I have also personally reperformed the history, physical exam and all medical decision making activities of this service and have verified that all service and findings are accurately documented in this student's note.    04/23/2019, CNM 02/24/2019 12:28 AM

## 2019-02-23 NOTE — Progress Notes (Signed)
Patient ID: Shannon Hogan, female   DOB: [birth year], 19 y.o.   MRN: [id number] Called to room for FHR decel Patient was out of bed and FHR decel was noted Patient had about 5 UCs close together  Usual measures of intrauterine resuscitation were employed Flattened Head of bed with resolution of FHR  Foley bulb had come out just prior to this  Dilation: 4 Effacement (%): 60 Cervical Position: Posterior Station: -2 Presentation: Vertex Exam by:: Jonelle Sidle, RN  Will observe for now

## 2019-02-23 NOTE — Progress Notes (Signed)
Shannon Hogan is a 19 y.o. G1P0 at [redacted]w[redacted]d admitted for IOL 2/2 gHTN  Subjective: Contractions feeling more painful, considering epidural  Objective: BP 118/68   Pulse 89   Temp 98 F (36.7 C) (Oral)   Resp 16   Ht 5\' 2"  (1.575 m)   Wt 77 kg   LMP 05/06/2018 (Exact Date)   SpO2 99%   BMI 31.06 kg/m  No intake/output data recorded.  FHT:  FHR: 155 bpm, variability: moderate,  accelerations:  Present,  decelerations:  Present occasional, when she stands up to use the restroom UC:   regular, every 3-4 minutes  SVE:   Dilation: 4.5 Effacement (%): 60 Station: -2 Exam by:: Sparicino MD  Labs: Lab Results  Component Value Date   WBC 12.2 (H) 02/23/2019   HGB 9.4 (L) 02/23/2019   HCT 29.4 (L) 02/23/2019   MCV 72.1 (L) 02/23/2019   PLT 214 02/23/2019    Assessment / Plan: 19 yo G1P0 at 39.5 here for IOL 2/2 gHTN.  Labor: s/p FB, cyto x3. Patient does not tolerate cervical exams well. Cervix still feels unchanged and very posterior, bulging bag present. Would recommend epidural prior to AROM and pitocin initiation. Fetal Wellbeing:  Category I Pain Control:  per patient request GHTN: most recent BPs normotensive. Pre-E labs normal I/D:  Positive in urine, PCN Anticipated MOD:  vaginal, CS as appropriate  26 DO OB Fellow, Faculty Practice 02/23/2019, 1:47 PM

## 2019-02-23 NOTE — Anesthesia Preprocedure Evaluation (Signed)
Anesthesia Evaluation  Patient identified by MRN, date of birth, ID band Patient awake    Reviewed: Allergy & Precautions, NPO status , Patient's Chart, lab work & pertinent test results  Airway Mallampati: II  TM Distance: >3 FB Neck ROM: Full    Dental  (+) Teeth Intact, Dental Advisory Given   Pulmonary neg pulmonary ROS,    Pulmonary exam normal breath sounds clear to auscultation       Cardiovascular hypertension (PIH), Normal cardiovascular exam Rhythm:Regular Rate:Normal     Neuro/Psych negative neurological ROS     GI/Hepatic negative GI ROS, Neg liver ROS,   Endo/Other  Obesity   Renal/GU negative Renal ROS     Musculoskeletal negative musculoskeletal ROS (+)   Abdominal   Peds  Hematology  (+) Blood dyscrasia (Plt 214k), anemia ,   Anesthesia Other Findings Day of surgery medications reviewed with the patient.  Reproductive/Obstetrics (+) Pregnancy                             Anesthesia Physical Anesthesia Plan  ASA: II  Anesthesia Plan: Epidural   Post-op Pain Management:    Induction:   PONV Risk Score and Plan: 2 and Treatment may vary due to age or medical condition  Airway Management Planned: Natural Airway  Additional Equipment:   Intra-op Plan:   Post-operative Plan:   Informed Consent: I have reviewed the patients History and Physical, chart, labs and discussed the procedure including the risks, benefits and alternatives for the proposed anesthesia with the patient or authorized representative who has indicated his/her understanding and acceptance.     Dental advisory given  Plan Discussed with:   Anesthesia Plan Comments: (Patient identified. Risks/Benefits/Options discussed with patient including but not limited to bleeding, infection, nerve damage, paralysis, failed block, incomplete pain control, headache, blood pressure changes, nausea, vomiting,  reactions to medication both or allergic, itching and postpartum back pain. Confirmed with bedside nurse the patient's most recent platelet count. Confirmed with patient that they are not currently taking any anticoagulation, have any bleeding history or any family history of bleeding disorders. Patient expressed understanding and wished to proceed. All questions were answered. )        Anesthesia Quick Evaluation

## 2019-02-23 NOTE — Progress Notes (Signed)
Patient ID: Shannon Hogan, female   DOB: 2000/11/08, 19 y.o.   MRN: [id number] Discussed Foley bulb insertion Patient agrees to try  Foley inserted into cervix and inflated balloon Dilation: Fingertip Effacement (%): 60 Cervical Position: Posterior Station: -3 Presentation: Vertex Exam by:: Wynelle Bourgeois, CNM  FHR reassuring UCs irregular  Had a lot of pain with insertion Fentanyl given for pain  Will observe and hold Cytotec for now

## 2019-02-24 ENCOUNTER — Encounter (HOSPITAL_COMMUNITY)
Admission: AD | Disposition: A | Discharge status: Home / Self Care | Payer: Self-pay | Source: Home / Self Care | Attending: Obstetrics and Gynecology

## 2019-02-24 ENCOUNTER — Encounter (HOSPITAL_COMMUNITY): Payer: Self-pay | Admitting: Family Medicine

## 2019-02-24 DIAGNOSIS — O139 Gestational [pregnancy-induced] hypertension without significant proteinuria, unspecified trimester: Secondary | ICD-10-CM

## 2019-02-24 DIAGNOSIS — O4593 Premature separation of placenta, unspecified, third trimester: Secondary | ICD-10-CM

## 2019-02-24 DIAGNOSIS — Z3A39 39 weeks gestation of pregnancy: Secondary | ICD-10-CM

## 2019-02-24 DIAGNOSIS — O3663X0 Maternal care for excessive fetal growth, third trimester, not applicable or unspecified: Secondary | ICD-10-CM

## 2019-02-24 DIAGNOSIS — O99824 Streptococcus B carrier state complicating childbirth: Secondary | ICD-10-CM

## 2019-02-24 DIAGNOSIS — O4590 Premature separation of placenta, unspecified, unspecified trimester: Secondary | ICD-10-CM

## 2019-02-24 DIAGNOSIS — O134 Gestational [pregnancy-induced] hypertension without significant proteinuria, complicating childbirth: Secondary | ICD-10-CM

## 2019-02-24 SURGERY — Surgical Case
Anesthesia: Epidural | Site: Abdomen | Wound class: Clean Contaminated

## 2019-02-24 MED ORDER — PRENATAL MULTIVITAMIN CH
1.0000 | ORAL_TABLET | Freq: Every day | ORAL | Status: DC
  Administered 2019-02-24 – 2019-02-26 (×3): 1 via ORAL
  Filled 2019-02-24 (×3): qty 1

## 2019-02-24 MED ORDER — OXYCODONE HCL 5 MG PO TABS: 5.0000 mg | ORAL_TABLET | Freq: Once | ORAL | Status: DC | PRN

## 2019-02-24 MED ORDER — MORPHINE SULFATE (PF) 0.5 MG/ML IJ SOLN
INTRAMUSCULAR | Status: DC | PRN
  Administered 2019-02-24: 3 mg via EPIDURAL

## 2019-02-24 MED ORDER — LACTATED RINGERS IV SOLN: INTRAVENOUS | Status: DC

## 2019-02-24 MED ORDER — WITCH HAZEL-GLYCERIN EX PADS: 1.0000 "application " | MEDICATED_PAD | CUTANEOUS | Status: DC | PRN

## 2019-02-24 MED ORDER — BUPIVACAINE HCL (PF) 0.25 % IJ SOLN
INTRAMUSCULAR | Status: AC
  Filled 2019-02-24: qty 20

## 2019-02-24 MED ORDER — FENTANYL CITRATE (PF) 100 MCG/2ML IJ SOLN: 25.0000 ug | INTRAMUSCULAR | Status: DC | PRN

## 2019-02-24 MED ORDER — STERILE WATER FOR IRRIGATION IR SOLN
Status: DC | PRN
  Administered 2019-02-24: 1000 mL

## 2019-02-24 MED ORDER — DIBUCAINE (PERIANAL) 1 % EX OINT: 1.0000 "application " | TOPICAL_OINTMENT | CUTANEOUS | Status: DC | PRN

## 2019-02-24 MED ORDER — ONDANSETRON HCL 4 MG/2ML IJ SOLN
INTRAMUSCULAR | Status: DC | PRN
  Administered 2019-02-24: 4 mg via INTRAVENOUS

## 2019-02-24 MED ORDER — SODIUM CHLORIDE 0.9 % IV SOLN: INTRAVENOUS | Status: DC | PRN

## 2019-02-24 MED ORDER — BUPIVACAINE HCL (PF) 0.25 % IJ SOLN
INTRAMUSCULAR | Status: AC
  Filled 2019-02-24: qty 10

## 2019-02-24 MED ORDER — MEPERIDINE HCL 25 MG/ML IJ SOLN: 6.2500 mg | INTRAMUSCULAR | Status: DC | PRN

## 2019-02-24 MED ORDER — SIMETHICONE 80 MG PO CHEW
80.0000 mg | CHEWABLE_TABLET | Freq: Three times a day (TID) | ORAL | Status: DC
  Administered 2019-02-24 – 2019-02-26 (×5): 80 mg via ORAL
  Filled 2019-02-24 (×6): qty 1

## 2019-02-24 MED ORDER — SODIUM CHLORIDE 0.9 % IV SOLN
500.0000 mg | INTRAVENOUS | Status: AC
  Administered 2019-02-24: 500 mg via INTRAVENOUS
  Filled 2019-02-24: qty 500

## 2019-02-24 MED ORDER — LIDOCAINE-EPINEPHRINE (PF) 2 %-1:200000 IJ SOLN
INTRAMUSCULAR | Status: AC
  Filled 2019-02-24: qty 10

## 2019-02-24 MED ORDER — OXYTOCIN 40 UNITS IN NORMAL SALINE INFUSION - SIMPLE MED: 2.5000 [IU]/h | INTRAVENOUS | Status: AC

## 2019-02-24 MED ORDER — COCONUT OIL OIL
1.0000 "application " | TOPICAL_OIL | Status: DC | PRN
  Administered 2019-02-24: 1 via TOPICAL

## 2019-02-24 MED ORDER — TETANUS-DIPHTH-ACELL PERTUSSIS 5-2.5-18.5 LF-MCG/0.5 IM SUSP: 0.5000 mL | Freq: Once | INTRAMUSCULAR | Status: DC

## 2019-02-24 MED ORDER — PHENYLEPHRINE 40 MCG/ML (10ML) SYRINGE FOR IV PUSH (FOR BLOOD PRESSURE SUPPORT)
PREFILLED_SYRINGE | INTRAVENOUS | Status: AC
  Filled 2019-02-24: qty 10

## 2019-02-24 MED ORDER — ACETAMINOPHEN 325 MG PO TABS
650.0000 mg | ORAL_TABLET | Freq: Four times a day (QID) | ORAL | Status: DC | PRN
  Administered 2019-02-24 – 2019-02-26 (×4): 650 mg via ORAL
  Filled 2019-02-24 (×4): qty 2

## 2019-02-24 MED ORDER — OXYCODONE HCL 5 MG/5ML PO SOLN: 5.0000 mg | Freq: Once | ORAL | Status: DC | PRN

## 2019-02-24 MED ORDER — OXYCODONE HCL 5 MG PO TABS
5.0000 mg | ORAL_TABLET | ORAL | Status: DC | PRN
  Administered 2019-02-25: 22:00:00 5 mg via ORAL
  Administered 2019-02-25: 10 mg via ORAL
  Administered 2019-02-25 – 2019-02-26 (×2): 5 mg via ORAL
  Filled 2019-02-24: qty 1
  Filled 2019-02-24: qty 2
  Filled 2019-02-24 (×2): qty 1

## 2019-02-24 MED ORDER — MENTHOL 3 MG MT LOZG: 1.0000 | LOZENGE | OROMUCOSAL | Status: DC | PRN

## 2019-02-24 MED ORDER — IBUPROFEN 800 MG PO TABS
800.0000 mg | ORAL_TABLET | Freq: Three times a day (TID) | ORAL | Status: DC
  Administered 2019-02-24 – 2019-02-26 (×7): 800 mg via ORAL
  Filled 2019-02-24 (×7): qty 1

## 2019-02-24 MED ORDER — SENNOSIDES-DOCUSATE SODIUM 8.6-50 MG PO TABS
2.0000 | ORAL_TABLET | ORAL | Status: DC
  Administered 2019-02-25 (×2): 2 via ORAL
  Filled 2019-02-24 (×2): qty 2

## 2019-02-24 MED ORDER — CEFAZOLIN SODIUM-DEXTROSE 2-4 GM/100ML-% IV SOLN
2.0000 g | INTRAVENOUS | Status: AC
  Administered 2019-02-24: 2 g via INTRAVENOUS
  Filled 2019-02-24: qty 100

## 2019-02-24 MED ORDER — LIDOCAINE-EPINEPHRINE (PF) 2 %-1:200000 IJ SOLN
INTRAMUSCULAR | Status: DC | PRN
  Administered 2019-02-24: 5 mL via EPIDURAL
  Administered 2019-02-24: 10 mL via EPIDURAL

## 2019-02-24 MED ORDER — DIPHENHYDRAMINE HCL 25 MG PO CAPS
25.0000 mg | ORAL_CAPSULE | Freq: Four times a day (QID) | ORAL | Status: DC | PRN
  Administered 2019-02-24: 25 mg via ORAL
  Filled 2019-02-24: qty 1

## 2019-02-24 MED ORDER — ONDANSETRON HCL 4 MG/2ML IJ SOLN
INTRAMUSCULAR | Status: AC
  Filled 2019-02-24: qty 2

## 2019-02-24 MED ORDER — OXYTOCIN 40 UNITS IN NORMAL SALINE INFUSION - SIMPLE MED
INTRAVENOUS | Status: AC
  Filled 2019-02-24: qty 1000

## 2019-02-24 MED ORDER — FENTANYL CITRATE (PF) 100 MCG/2ML IJ SOLN
INTRAMUSCULAR | Status: AC
  Filled 2019-02-24: qty 2

## 2019-02-24 MED ORDER — ACETAMINOPHEN 325 MG PO TABS: 325.0000 mg | ORAL_TABLET | ORAL | Status: DC | PRN

## 2019-02-24 MED ORDER — ENOXAPARIN SODIUM 40 MG/0.4ML ~~LOC~~ SOLN
40.0000 mg | SUBCUTANEOUS | Status: DC
  Administered 2019-02-25 – 2019-02-26 (×2): 40 mg via SUBCUTANEOUS
  Filled 2019-02-24 (×2): qty 0.4

## 2019-02-24 MED ORDER — MORPHINE SULFATE (PF) 0.5 MG/ML IJ SOLN
INTRAMUSCULAR | Status: AC
  Filled 2019-02-24: qty 10

## 2019-02-24 MED ORDER — SIMETHICONE 80 MG PO CHEW: 80.0000 mg | CHEWABLE_TABLET | ORAL | Status: DC | PRN

## 2019-02-24 MED ORDER — BUPIVACAINE HCL (PF) 0.25 % IJ SOLN
INTRAMUSCULAR | Status: DC | PRN
  Administered 2019-02-24: 30 mL

## 2019-02-24 MED ORDER — ACETAMINOPHEN 160 MG/5ML PO SOLN: 325.0000 mg | ORAL | Status: DC | PRN

## 2019-02-24 MED ORDER — SODIUM CHLORIDE 0.9 % IR SOLN
Status: DC | PRN
  Administered 2019-02-24: 1000 mL

## 2019-02-24 MED ORDER — SIMETHICONE 80 MG PO CHEW
80.0000 mg | CHEWABLE_TABLET | ORAL | Status: DC
  Administered 2019-02-25 (×2): 80 mg via ORAL
  Filled 2019-02-24 (×2): qty 1

## 2019-02-24 MED ORDER — ZOLPIDEM TARTRATE 5 MG PO TABS: 5.0000 mg | ORAL_TABLET | Freq: Every evening | ORAL | Status: DC | PRN

## 2019-02-24 MED ORDER — ONDANSETRON HCL 4 MG/2ML IJ SOLN: 4.0000 mg | Freq: Once | INTRAMUSCULAR | Status: DC | PRN

## 2019-02-24 SURGICAL SUPPLY — 30 items
BENZOIN TINCTURE PRP APPL 2/3 (GAUZE/BANDAGES/DRESSINGS) ×3 IMPLANT
CHLORAPREP W/TINT 26ML (MISCELLANEOUS) ×3 IMPLANT
CLAMP CORD UMBIL (MISCELLANEOUS) ×3 IMPLANT
CLOSURE WOUND 1/2 X4 (GAUZE/BANDAGES/DRESSINGS) ×1
CLOTH BEACON ORANGE TIMEOUT ST (SAFETY) ×3 IMPLANT
DRSG OPSITE POSTOP 4X10 (GAUZE/BANDAGES/DRESSINGS) ×3 IMPLANT
ELECT REM PT RETURN 9FT ADLT (ELECTROSURGICAL) ×3
ELECTRODE REM PT RTRN 9FT ADLT (ELECTROSURGICAL) ×1 IMPLANT
GLOVE BIOGEL PI IND STRL 6.5 (GLOVE) ×1 IMPLANT
GLOVE BIOGEL PI IND STRL 7.0 (GLOVE) ×2 IMPLANT
GLOVE BIOGEL PI INDICATOR 6.5 (GLOVE) ×2
GLOVE BIOGEL PI INDICATOR 7.0 (GLOVE) ×4
GLOVE ORTHOPEDIC STR SZ6.5 (GLOVE) ×3 IMPLANT
GOWN STRL REUS W/TWL LRG LVL3 (GOWN DISPOSABLE) ×9 IMPLANT
NEEDLE HYPO 22GX1.5 SAFETY (NEEDLE) ×3 IMPLANT
NS IRRIG 1000ML POUR BTL (IV SOLUTION) ×3 IMPLANT
PACK C SECTION WH (CUSTOM PROCEDURE TRAY) ×3 IMPLANT
PAD OB MATERNITY 4.3X12.25 (PERSONAL CARE ITEMS) ×3 IMPLANT
PENCIL SMOKE EVAC W/HOLSTER (ELECTROSURGICAL) ×3 IMPLANT
STRIP CLOSURE SKIN 1/2X4 (GAUZE/BANDAGES/DRESSINGS) ×2 IMPLANT
SUT MON AB 4-0 PS1 27 (SUTURE) ×3 IMPLANT
SUT PLAIN 2 0 (SUTURE) ×2
SUT PLAIN ABS 2-0 CT1 27XMFL (SUTURE) ×1 IMPLANT
SUT VIC AB 0 CT1 36 (SUTURE) ×6 IMPLANT
SUT VIC AB 0 CTX 36 (SUTURE) ×2
SUT VIC AB 0 CTX36XBRD ANBCTRL (SUTURE) ×1 IMPLANT
SUT VIC AB 2-0 CT1 (SUTURE) ×3 IMPLANT
SYR CONTROL 10ML LL (SYRINGE) ×3 IMPLANT
TOWEL OR 17X24 6PK STRL BLUE (TOWEL DISPOSABLE) ×3 IMPLANT
WATER STERILE IRR 1000ML POUR (IV SOLUTION) ×3 IMPLANT

## 2019-02-24 NOTE — Progress Notes (Signed)
Called by RN for prolonged decel at 0140; requested RN to call Dr. Debroah Loop.   In to see patient at 0150 Shannon Hogan is a 19 y.o. G1P0 at [redacted]w[redacted]d  admitted for gHTN. Concern for LGA.   Subjective:  Patient coping well on hands and knees.  Objective: Vitals:   02/23/19 2330 02/24/19 0000 02/24/19 0030 02/24/19 0100  BP: 116/67 127/80 126/84 122/76  Pulse: 99 95 (!) 102 (!) 106  Resp:      Temp:      TempSrc:      SpO2:      Weight:      Height:       Total I/O In: -  Out: 825 [Urine:825]  FHT:  150 bpm, mod var, present acel, neg decels.  UC:  q 2 SVE: deferred Pitocin @ 10 mu/min  Labs: Lab Results  Component Value Date   WBC 12.2 (H) 02/23/2019   HGB 9.4 (L) 02/23/2019   HCT 29.4 (L) 02/23/2019   MCV 72.1 (L) 02/23/2019   PLT 214 02/23/2019    Assessment / Plan: IUPC, FSE, and amnioinfusion in process. Dr. Debroah Loop aware of patient's tracing; will continue to monitor closely and return to high fowlers once tracing has improved. Will D/C pitocin if FHR has another prolonged deceleration.   Labor: early labor, progressing  slowly Fetal Wellbeing:  Category II Pain Control:  Epidural Anticipated MOD:  NSVD  Shannon Hogan 02/24/2019, 2:01 AM

## 2019-02-24 NOTE — Progress Notes (Signed)
IN room to examine patient when patient had prolonged variable; patient repositioned and examined. Shannon Hogan is a 19 y.o. G1P0 at [redacted]w[redacted]d  admitted for gHTN.   Subjective: Patient feeling more pressure  Objective: Vitals:   02/23/19 2330 02/24/19 0000 02/24/19 0030 02/24/19 0100  BP: 116/67 127/80 126/84 122/76  Pulse: 99 95 (!) 102 (!) 106  Resp:      Temp:      TempSrc:      SpO2:      Weight:      Height:       Total I/O In: -  Out: 825 [Urine:825]  FHT:  FHR: 155 bpm, variability: minimal ,  accelerations:  Abscent,  decelerations:  Present one large variable with repositioning, then another large variable after final reposition. Now FHR is baseline of 155, minimal to moderate variable, no acels, no decels now, CTX are irregular.   SVE:   Dilation: 6 Effacement (%): 90 Station: -1 Exam by:: Suman Trivedi, CNM Pitocin @  0 mu/min  Labs: Lab Results  Component Value Date   WBC 12.2 (H) 02/23/2019   HGB 9.4 (L) 02/23/2019   HCT 29.4 (L) 02/23/2019   MCV 72.1 (L) 02/23/2019   PLT 214 02/23/2019    Assessment / Plan: Dr. Debroah Loop notified of patient's tracing. Plan to discontinue pitocin for now, then restart in 30 minutes. Patient repositioned in high Fowlers, no signs or symptoms of pre-e. BP is normal thus far.  Will continue to watch closely, continue amnioinfusion for now.   Shannon Hogan   02/24/2019, 2:19 AM

## 2019-02-24 NOTE — Anesthesia Postprocedure Evaluation (Signed)
Anesthesia Post Note  Patient: Shannon Hogan  Procedure(s) Performed: CESAREAN SECTION (N/A Abdomen)     Patient location during evaluation: Mother Baby Anesthesia Type: Epidural Level of consciousness: awake and alert Pain management: pain level controlled Vital Signs Assessment: post-procedure vital signs reviewed and stable Respiratory status: spontaneous breathing, nonlabored ventilation and respiratory function stable Cardiovascular status: stable Postop Assessment: no headache, no backache and epidural receding Anesthetic complications: no    Last Vitals:  Vitals:   02/24/19 0900 02/24/19 1035  BP: 122/82   Pulse: (!) 113   Resp: 20   Temp:  36.8 C  SpO2:      Last Pain:  Vitals:   02/24/19 1035  TempSrc: Oral  PainSc:    Pain Goal:                   Mayara Paulson

## 2019-02-24 NOTE — Progress Notes (Signed)
Faculty Note  Patient has made slow progress overnight with minimal change in dilation and intermittent decels. Pitocin currently off and when it has been restarted overnight, she has had decels. CNM saw patient this am and offered to proceed with 1CS versus restart pitocin, pt and husband opt to proceed with 1CS. I am in agreement with plan.   The risks of cesarean section were discussed with the patient; including but not limited to: infection which may require antibiotics; bleeding which may require transfusion or re-operation; injury to bowel, bladder, ureters or other surrounding organs; injury to the fetus; need for additional procedures including hysterectomy in the event of a life-threatening hemorrhage; placental abnormalities wth subsequent pregnancies, risk of needing c-sections in future pregnancies, incisional problems, thromboembolic phenomenon and other postoperative/anesthesia complications. Answered all questions. The patient verbalized understanding of the plan, giving informed consent for the procedure. She is agreeable to blood transfusion in the event of emergency.  She will remain NPO for procedure Anesthesia and OR aware Preoperative prophylactic antibiotics and SCDs ordered on call to the OR  To OR when ready   K. Therese Sarah, M.D. Attending Center for [employer] Midwife)

## 2019-02-24 NOTE — Progress Notes (Signed)
   Shannon Hogan is a 19 y.o. G1P0 at [redacted]w[redacted]d  admitted for induction of labor due to Hypertension.  Subjective: Sleeping in high fowlers.   Objective: Vitals:   02/24/19 0330 02/24/19 0403 02/24/19 0430 02/24/19 0500  BP: 129/81 125/87 128/78 123/80  Pulse: (!) 117 (!) 101 96 96  Resp:  20 18 18   Temp:      TempSrc:      SpO2:      Weight:      Height:       Total I/O In: -  Out: 825 [Urine:825]  FHT:  FHR: 145 bpm, variability: moderate,  accelerations:  Abscent,  decelerations:  Absent UC:   irregular, every 3 minutes SVE:   Dilation: 6.5 Effacement (%): 90 Station: 0 Exam by:: [email or web address], RN Pitocin @ 2 mu/min  Labs: Lab Results  Component Value Date   WBC 12.2 (H) 02/23/2019   HGB 9.4 (L) 02/23/2019   HCT 29.4 (L) 02/23/2019   MCV 72.1 (L) 02/23/2019   PLT 214 02/23/2019    Assessment / Plan: Protracted active phase Patient now at 0 station per RN exam, will leave pitocin at 2 mu and continue to watch carefully. Patient has made slow change over the past 36 hours, but now with 0 station, hopeful for vaginal delivery.  -continue with amnioinfusion, FSE, IUPC intact.  Labor: slow progress Fetal Wellbeing:  Category I Pain Control:  Epidural Anticipated MOD:  NSVD  04/23/2019 Toris Laverdiere 02/24/2019, 5:11 AM

## 2019-02-24 NOTE — Progress Notes (Signed)
In room to assess patient; patient continuing to have occasional late decelerations. Patient now 7/0/ 90%. While repositioning patient after cervical exam, fetal heart decelerated in the 60-70s. Patient was moved to left lateral and right lateral, then hands and knees, which improved fetal heart rate. Dr. Debroah Loop was notified and was at the bedside. Pitocin was discontinued and patient was placed on 10L of O2. After heart rate recovered and moderate variability was observed, patient was moved into high fowlers. Plan to keep pitocin turned off, continue amnioinfusion, OR and Dr. Debroah Loop aware of possible need for urgent operative delivery.  Patient updated on the fetal status and possible need for operative delivery. She agrees with plan of care.  Luna Kitchens

## 2019-02-24 NOTE — Discharge Summary (Signed)
Postpartum Discharge Summary     Patient Name: Shannon Hogan DOB: 2000-12-02 MRN: [id number]  Date of admission: 02/22/2019 Delivering Provider: Sloan Leiter   Date of discharge: 02/26/2019  Admitting diagnosis: Post-dates pregnancy [O48.0] Intrauterine pregnancy: [redacted]w[redacted]d    Secondary diagnosis:  Active Problems:   Post-dates pregnancy   Gestational hypertension   Placental abruption   Acute blood loss anemia  Additional problems: None     Discharge diagnosis: Term Pregnancy Delivered and Gestational Hypertension                                                                                                Post partum procedures:IV iron  Augmentation: AROM, Pitocin, Cytotec and Foley Balloon  Complications: Placental Abruption  Hospital course:  Induction of Labor With Cesarean Section  19y.o. yo G1P0 at 353w6das admitted to the hospital 02/22/2019 for induction of labor due to gHTN. Patient induced with Cytotec, Foley balloon, AROM and Pitocin. The patient went for cesarean section due to Arrest of Dilation, Arrest of Descent and Non-Reassuring FHR, and delivered a Viable infant,02/24/2019  Membrane Rupture Time/Date: 6:35 PM ,02/23/2019   Details of operation can be found in separate operative Note.  Patient had a postpartum course notable for ABL anemia and got IV iron x 1. BP's monitored and were normal. Opted for OCP's at 6 weeks for birth control. She is ambulating, tolerating a regular diet, passing flatus, and urinating well.  Patient is discharged home in stable condition on 02/26/19.                                  Delivery time: 9:52 AM   Magnesium Sulfate received: No BMZ received: No Rhophylac:No MMR:No Transfusion:No  Physical exam  Vitals:   02/25/19 2010 02/25/19 2321 02/26/19 0414 02/26/19 0930  BP: 136/82 120/80 128/81 128/82  Pulse: 90 85 94 78  Resp: '18 18 18 18  ' Temp: 98 F (36.7 C) (!) 97.4 F (36.3 C) 97.6 F (36.4 C) 97.6 F (36.4 C)  TempSrc:  Oral Oral Oral Oral  SpO2: 99% 99% 99% 100%  Weight:      Height:       General: alert, cooperative and no distress Lochia: appropriate Uterine Fundus: firm Incision: Healing well with no significant drainage DVT Evaluation: No evidence of DVT seen on physical exam. Labs: Lab Results  Component Value Date   WBC 15.9 (H) 02/25/2019   HGB 6.9 (LL) 02/25/2019   HCT 22.3 (L) 02/25/2019   MCV 74.6 (L) 02/25/2019   PLT 157 02/25/2019   CMP Latest Ref Rng & Units 02/25/2019  Glucose 70 - 99 mg/dL -  BUN 6 - 20 mg/dL -  Creatinine 0.44 - 1.00 mg/dL 1.22(H)  Sodium 135 - 145 mmol/L -  Potassium 3.5 - 5.1 mmol/L -  Chloride 98 - 111 mmol/L -  CO2 22 - 32 mmol/L -  Calcium 8.9 - 10.3 mg/dL -  Total Protein 6.5 - 8.1 g/dL -  Total Bilirubin 0.3 - 1.2 mg/dL -  Alkaline Phos 38 - 126 U/L -  AST 15 - 41 U/L -  ALT 0 - 44 U/L -    Discharge instruction: per After Visit Summary and "Baby and Me Booklet".  After visit meds:  Allergies as of 02/26/2019   No Known Allergies     Medication List    STOP taking these medications   aspirin EC 81 MG tablet     TAKE these medications   ibuprofen 800 MG tablet Commonly known as: ADVIL Take 1 tablet (800 mg total) by mouth every 8 (eight) hours.   nystatin ointment Commonly known as: MYCOSTATIN Apply 1 application topically 2 (two) times daily. Apply to external areas only   oxyCODONE-acetaminophen 5-325 MG tablet Commonly known as: PERCOCET/ROXICET Take 1-2 tablets by mouth every 6 (six) hours as needed.   terconazole 0.4 % vaginal cream Commonly known as: Terazol 7 Place 1 applicator vaginally at bedtime for 7 days.   Vitafol Gummies 3.33-0.333-34.8 MG Chew Chew 3 each by mouth daily.       Diet: routine diet  Activity: Advance as tolerated. Pelvic rest for 6 weeks.   Outpatient follow up:4 weeks Follow up Appt: Future Appointments  Date Time Provider [place]  02/27/2019  8:45 AM [facility] None   03/14/2019 10:10 AM Laury Deep, CNM [facility] None  04/11/2019  1:10 PM Laury Deep, CNM [facility] None   Follow up Visit: Follow-up Information    [facility].   Specialty: Obstetrics and Gynecology Contact information: [place] [phone]           Please schedule this patient for Postpartum visit in: 4 weeks with the following provider: Any provider For C/S patients schedule nurse incision check in weeks 2 weeks: yes High risk pregnancy complicated by: HTN Delivery mode:  CS Anticipated Birth Control:  OCPs PP Procedures needed: BP and incision check  Schedule Integrated BH visit: no    Newborn Data: Live born female  Birth Weight: 8 lb 14.9 oz (4050 g) APGAR: 2, 7  Newborn Delivery   Birth date/time: 02/24/2019 09:52:00 Delivery type: C-Section, Low Transverse Trial of labor: Yes C-section categorization: Primary      Baby Feeding: Breast Disposition:home with mother   02/26/2019 Donnamae Jude, MD

## 2019-02-24 NOTE — Op Note (Addendum)
Shannon Hogan PROCEDURE DATE: 02/24/2019  PREOPERATIVE DIAGNOSES: Intrauterine pregnancy at [redacted]w[redacted]d weeks gestation; failure to progress: arrest of dilation and elective  POSTOPERATIVE DIAGNOSES: The same; Suspected Placental Abruption   PROCEDURE: Primary Low Transverse Cesarean Section  SURGEON:  Dr. Vivien Rota - Primary Barrington Ellison, MD - Fellow  ANESTHESIOLOGY TEAM: Anesthesiologist: Janeece Riggers, MD; Catalina Gravel, MD CRNA: Jonna Munro, CRNA  INDICATIONS: Shannon Hogan is a 19 y.o. G1P0 at [redacted]w[redacted]d here for cesarean section secondary to the indications listed under preoperative diagnoses; please see preoperative note for further details.  The risks of cesarean section were discussed with the patient including but were not limited to: bleeding which may require transfusion or reoperation; infection which may require antibiotics; injury to bowel, bladder, ureters or other surrounding organs; injury to the fetus; need for additional procedures including hysterectomy in the event of a life-threatening hemorrhage; placental abnormalities wth subsequent pregnancies, incisional problems, thromboembolic phenomenon and other postoperative/anesthesia complications.   The patient concurred with the proposed plan, giving informed written consent for the procedure.    FINDINGS:  Viable female infant in cephalic presentation (LOT). Clear amniotic fluid.  Intact placenta, three vessel cord.  Normal uterus, fallopian tubes and ovaries bilaterally. Calcified-appearing placenta removed intact with significant amount of dark blood noted posteriorly that appeared as soon as placenta started to separate; suspect active abruption.  APGAR (1 MIN): 2   APGAR (5 MINS): 7   APGAR (10 MINS):   Weight: 4050g  ANESTHESIA: Epidural  INTRAVENOUS FLUIDS: 400 ml   ESTIMATED BLOOD LOSS: 1083 ml URINE OUTPUT:  200 ml SPECIMENS: Placenta sent to pathology COMPLICATIONS: None immediate  PROCEDURE IN  DETAIL:  The patient preoperatively received intravenous antibiotics and had sequential compression devices applied to her lower extremities.  She was then taken to the operating room where the epidural anesthesia was dosed up to surgical level and was found to be adequate. She was then placed in a dorsal supine position with a leftward tilt, and prepped and draped in a sterile manner.  A foley catheter had been previously placed into her bladder and attached to constant gravity.  After an adequate timeout was performed, a Pfannenstiel skin incision was made with scalpel and carried through to the underlying layer of fascia. The fascia was incised in the midline, and this incision was extended bilaterally using the Mayo scissors.  Kocher clamps were applied to the inferior aspect of the fascial incision and the underlying rectus muscles were dissected off bluntly and sharply.  A similar process was carried out on the superior aspect of the fascial incision. The rectus muscles were separated in the midline and the peritoneum was entered bluntly. The Alexis self-retaining retractor was introduced into the abdominal cavity.  Attention was turned to the lower uterine segment where a low transverse hysterotomy was made with a scalpel and extended bilaterally bluntly.  The infant was successfully delivered, the cord was clamped and cut just under one minute due to lack of spontaneous cry and the infant was handed over to the awaiting neonatology team. Calcified-appearing placenta removed intact with significant amount of dark blood noted posteriorly; suspect active abruption. The placenta delivered intact with a three-vessel cord. The uterus was then cleared of clots and debris.  The hysterotomy was closed with 0 Vicryl in a running locked fashion, and an imbricating layer was also placed with 0 Vicryl.  The pelvis was cleared of all clot and debris. Hemostasis was confirmed on all surfaces.  The retractor  was removed.   The peritoneum was closed with a 2-0 Vicryl running stitch and the rectus muscles were reapproximated using 0 Vicryl interrupted stitches. The fascia was then closed using 0 Vicryl in a running fashion.  The subcutaneous layer was irrigated, reapproximated with 2-0 plain gut interrupted stitches, and the skin was closed with a 4-0 Vicryl subcuticular stitch. 30 mL 0.25% Marcaine injected around incision site. The patient tolerated the procedure well. Sponge, instrument and needle counts were correct x 3.  She was taken to the recovery room in stable condition.   Jerilynn Birkenhead, MD OB Family Medicine Fellow, [facility] for [employer], [facility]    Attestation of Attending Supervision of [place] Fellow: Evaluation, management, and procedures were performed by the [facility] Fellow under my supervision and collaboration. I was scrubbed and present for all portions of this procedure. I agree with the documentation and plan.  Baldemar Lenis, M.D. Attending Center for [employer] (Faculty Practice)  02/24/2019 3:05 PM

## 2019-02-24 NOTE — Transfer of Care (Signed)
Immediate Anesthesia Transfer of Care Note  Patient: Shannon Hogan  Procedure(s) Performed: CESAREAN SECTION (N/A Abdomen)  Patient Location: PACU  Anesthesia Type:Epidural  Level of Consciousness: awake, alert , oriented and patient cooperative  Airway & Oxygen Therapy: Patient Spontanous Breathing  Post-op Assessment: Report given to RN and Post -op Vital signs reviewed and stable  Post vital signs: Reviewed and stable  Last Vitals:  Vitals Value Taken Time  BP 134/85 02/24/19 1036  Temp    Pulse 107 02/24/19 1039  Resp 19 02/24/19 1039  SpO2 100 % 02/24/19 1039  Vitals shown include unvalidated device data.  Last Pain:  Vitals:   02/24/19 0730  TempSrc: Oral  PainSc:          Complications: No apparent anesthesia complications

## 2019-02-24 NOTE — Progress Notes (Signed)
Shannon Hogan is a 19 y.o. G1P0 at [redacted]w[redacted]d  Subjective: Expressing concern about labor status, extreme fatigue. Patient states even if she was completely dilated she "doesn't think she has the energy to do anything"  Objective: BP 109/75   Pulse 97   Temp 99.2 F (37.3 C) (Oral)   Resp 18   Ht 5\' 2"  (1.575 m)   Wt 77 kg   LMP 05/06/2018 (Exact Date)   SpO2 100%   BMI 31.06 kg/m  I/O last 3 completed shifts: In: -  Out: 1675 [Urine:1675] No intake/output data recorded.  FHT:  FHR: 140 bpm, variability: minimal ,  accelerations:  Abscent,  decelerations:  Absent UC:   irregular, every 7 minutes SVE:   Dilation: 7 Effacement (%): 80 Station: 0 Exam by:: [email or web address], CNM  Labs: Lab Results  Component Value Date   WBC 12.2 (H) 02/23/2019   HGB 9.4 (L) 02/23/2019   HCT 29.4 (L) 02/23/2019   MCV 72.1 (L) 02/23/2019   PLT 214 02/23/2019    Assessment / Plan: --Pitocin discontinued by prior shift due to fetal intolerance --Discussed path forward with patient, restart Pitocin vs Cesarean section --Patient and FOB given time to discuss, elect Cesarean delivery without Pitocin trial --Dr. 04/23/2019 in department, notified --Prep for OR  Earlene Plater, CNM 02/24/2019, 8:56 AM

## 2019-02-24 NOTE — Lactation Note (Signed)
This note was copied from a baby's chart. Lactation Consultation Note  Patient Name: Shannon Hogan [id number] Date: 02/24/2019 Reason for consult: Initial assessment;Primapara;1st time breastfeeding;NICU baby;Term  GHTN on MgSO4  LC in to visit with P1 Mom of term baby transitioning in the NICU for respiratory distress.  Baby doing better and is currently on room air and taking formula by bottles.  Asked Mom how she planned to feed her baby.  Mom states she plans to try breastfeeding while in the hospital.  Mom had just gotten up to bathroom and became dizzy.  She was going to go to NICU, but decided to rest a while.    Talked to Mom about importance of double pumping to support her milk supply due to baby being separated from her.  Offered to set up DEBP and assist with first pumping if she felt up to it.  Explained that early pumping with positively affect her milk supply long term.    FOB is very supportive of Mom pumping/breastfeeding.  Not sure how committed Mom is.    Reviewed breast massage and hand expression.  Mom reports + breast changes in early pregnancy.  Unable to express drops currently, but Mom wincing with hand expression, so discontinued and encouraged Mom to do this herself.    Assisted Mom to pump for first time.  FOB holding one of the flanges for Mom.  24 mm flanges a good fit today.   Importance of disassembling pump parts, washing, rinsing and air drying parts explained.  Colostrum containers provided for Mom.  Mom aware of need to milk labels when she starts expressing colostrum for baby..  Mom knows to pump every 2-3 hrs if baby is in NICU.  Encouraged STS with baby in NICU or when baby returns to room.   NICU brochure left in room, and Lactation brochure left in room.  Parents told about IP and OP lactation support available.  Both parents appear overwhelmed and will need reinforcement again.  RN aware of Mom's desire to pump.   Interventions Interventions:  Breast feeding basics reviewed;Skin to skin;Breast massage;Hand express;DEBP  Lactation Tools Discussed/Used Pump Review: Setup, frequency, and cleaning;Milk Storage Initiated by:: Erby Pian RN IBCLC Date initiated:: 02/24/19   Consult Status Consult Status: Follow-up Date: 02/25/19 Follow-up type: In-patient    Shannon Hogan 02/24/2019, 4:44 PM

## 2019-02-25 ENCOUNTER — Ambulatory Visit: Payer: Medicaid Other

## 2019-02-25 LAB — CBC
HCT: 22.3 % — ABNORMAL LOW (ref 36.0–46.0)
Hemoglobin: 6.9 g/dL — CL (ref 12.0–15.0)
MCH: 23.1 pg — ABNORMAL LOW (ref 26.0–34.0)
MCHC: 30.9 g/dL (ref 30.0–36.0)
MCV: 74.6 fL — ABNORMAL LOW (ref 80.0–100.0)
Platelets: 157 10*3/uL (ref 150–400)
RBC: 2.99 MIL/uL — ABNORMAL LOW (ref 3.87–5.11)
RDW: 17 % — ABNORMAL HIGH (ref 11.5–15.5)
WBC: 15.9 10*3/uL — ABNORMAL HIGH (ref 4.0–10.5)
nRBC: 0 % (ref 0.0–0.2)

## 2019-02-25 LAB — CREATININE, SERUM
Creatinine, Ser: 1.22 mg/dL — ABNORMAL HIGH (ref 0.44–1.00)
GFR calc Af Amer: >60 mL/min (ref >60)
GFR calc non Af Amer: >60 mL/min (ref >60)

## 2019-02-25 MED ORDER — SODIUM CHLORIDE 0.9 % IV SOLN
510.0000 mg | Freq: Once | INTRAVENOUS | Status: AC
  Administered 2019-02-25: 15:00:00 510 mg via INTRAVENOUS
  Filled 2019-02-25: qty 17

## 2019-02-25 NOTE — Progress Notes (Signed)
CRITICAL VALUE ALERT  Critical Value:  Hgb 6.9  Date & Time Notied:  02/25/2019 1321  Provider Notified: Shawnie Pons  Orders Received/Actions taken: Waiting for repsonse

## 2019-02-25 NOTE — Lactation Note (Signed)
This note was copied from a baby's chart. Lactation Consultation Note  Patient Name: Shannon Hogan [id number] Date: 02/25/2019 Reason for consult: Mother's request;Follow-up assessment;Primapara;1st time breastfeeding;Term  Mother is a P1, requested for [facility] for a few questions concerning baby's intake at the breast. LC entered the room to see 39 wks 6 days baby. Baby was laying in the bassinet sucking on a pacifier. Mom stated she is unaware when baby is full and would like to be educated on infant intake, frequent feedings, and output.   LC observed infant getting fussy and smacking lips. Educated mom on the importance of watching feeding cues and cluster feeding. Mom stated she just fed baby at the breast for 30 mins but concerned that baby still seemed hungry. LC asked mom if she wanted to receive assistance latching baby. FOB and LC assisted baby to the breast in football hold on the left breast. Infant was able to successfully latch with audible swallows, suckling on and off for about 5 mins. Baby released breast several times during feeding and was redirected back to the breast. MOB was happily feeding baby, encouraging him to "keep going". Eventually baby became sleepy, and dad collected baby. LC praised mom for her efforts and asked mom to hand express. Colostrum was visibly seen and baby became very relaxed at the breast.   LC encouraged mom to call if needed. No further questions or concerns at this time. LC discouraged use of pacifier this evening and encouraged observing baby for hunger cues.  Mom will watch for feeding cues and signs of cluster feeding throughout the night. Mom will supplement with formula as needed, offering the breast first.   LC reviewed feeding plan provided by [facility] in earlier visit today and reinforced plan to breast feed first, continue to use DEBP and to supplement her EBM and/or formula post breast feedings as needed (10-20 mls). Shannon Hogan had a wide-mouthed bottle  nipple in the room which she was using for supplementation.  Feeding Feeding Type: Breast Fed  LATCH Score Latch: Repeated attempts needed to sustain latch, nipple held in mouth throughout feeding, stimulation needed to elicit sucking reflex.  Audible Swallowing: A few with stimulation  Type of Nipple: Everted at rest and after stimulation  Comfort (Breast/Nipple): Soft / non-tender  Hold (Positioning): Assistance needed to correctly position infant at breast and maintain latch.  LATCH Score: 7  Interventions Interventions: Breast feeding basics reviewed;Assisted with latch;Hand express;Support pillows  Lactation Tools Discussed/Used     Consult Status Consult Status: Follow-up Date: 02/26/19 Follow-up type: In-patient    Shannon Hogan 02/25/2019, 8:47 PM

## 2019-02-25 NOTE — Lactation Note (Signed)
This note was copied from a baby's chart. Lactation Consultation Note  Patient Name: Shannon Hogan NATFT'D Date: 02/25/2019 Reason for consult: Follow-up assessment;Primapara;1st time breastfeeding;Term  LC in to visit with P1 Mom of term baby at 17 hrs old.  Baby at 3% weight loss with good output. Baby transitioned back from NICU at about 8 hrs old.    Baby has been attempting at the breast, followed by formula supplementation by bottle.  Baby taking 10-22 ml by slow flow bottle. An [facility] RN initiated a 20 mm nipple shield to help baby latch, which Mom states helped.  Mom last pumped about 6 hrs ago.  Mom a bit discouraged about breast feeding.  Spent time reassuring her that baby was only now just a day old, and it takes time for baby and Mom to learn and get comfortable with positioning and latching baby.   Baby dressed and swaddled sleeping in the crib.  FOB states he had 10 ml of formula about 3 hrs ago.  Offered and recommended we unwrap and place baby STS to see if he would show signs of being interested in latching to breast.  Mom agreeable.  Changed a big void and stool.  Baby wide awake.   Sat Mom up with proper pillows, breast massage and hand expression reviewed and encouraged.  Mom tense and baby pushing off the bed in football hold.  Initiated the 20 mm nipple shield, reviewing how to properly place it.  Baby latched but unable to stay deeply latch.  Took shield off and baby opened his mouth wide and while sandwiching breast, he was able to attain depth.  Mom complained of pain for first minute, and then it subsided. Baby became rhythmic, relaxed, swallows identified.  Showed Mom how to break suction before taking baby off.  After feeding for 15 mins, nipple rounded and pulled out.   Recommended Mom focus on STS, and cue based breastfeeding.  To ask her RN for assistance and lactation will be called if RN unable to assist.   Reviewed a good latch, and baby's suck pattern to look  for.   Pump parts disassembled, washed, rinsed, dried and put back together for Mom to pump after baby breastfeeds.  Recommended she focus on breastfeeding and holding off on formula as baby can latch well.    To call prn.  Mom has a DEBP at home.  Feeding Feeding Type: Breast Fed Nipple Type: Slow - flow  LATCH Score Latch: Grasps breast easily, tongue down, lips flanged, rhythmical sucking.  Audible Swallowing: A few with stimulation  Type of Nipple: Everted at rest and after stimulation(short nipple shaft)  Comfort (Breast/Nipple): Soft / non-tender  Hold (Positioning): Assistance needed to correctly position infant at breast and maintain latch.  LATCH Score: 8  Interventions Interventions: Breast feeding basics reviewed;Assisted with latch;Skin to skin;Breast massage;Hand express;Breast compression;Adjust position;Support pillows;Position options;DEBP  Lactation Tools Discussed/Used Tools: Pump;Coconut oil;Bottle Breast pump type: Double-Electric Breast Pump   Consult Status Consult Status: Follow-up Date: 02/26/19 Follow-up type: In-patient    [name] 02/25/2019, 12:51 PM

## 2019-02-25 NOTE — Progress Notes (Signed)
Faculty Attending Note  Post Op Day 1  Subjective: Patient is feeling well. She reports moderately well controlled pain on PO pain meds. She is ambulating and denies light-headedness or dizziness. She is voiding spontaneously. She is not passing flatus, has not had a BM yet. She is tolerating a regular diet without nausea/vomiting. Bleeding is heavy. She is breast & bottle feeding. Baby is in room and doing well.  Objective: Blood pressure (!) 145/93, pulse 78, temperature 97.8 F (36.6 C), temperature source Oral, resp. rate 18, height 5\' 2"  (1.575 m), weight 77 kg, last menstrual period 05/06/2018, SpO2 100 %, unknown if currently breastfeeding. Temp:  [97.7 F (36.5 C)-98.9 F (37.2 C)] 97.8 F (36.6 C) (01/04 0350) Pulse Rate:  [60-113] 78 (01/04 0350) Resp:  [17-24] 18 (01/04 0000) BP: (107-145)/(75-96) 145/93 (01/04 0350) SpO2:  [89 %-100 %] 100 % (01/04 0350)  Physical Exam:  General: alert, oriented, cooperative Chest: normal respiratory effort Heart: RRR  Abdomen: soft, appropriately tender to palpation, incision covered by dressing with no evidence of active bleeding  Uterine Fundus: firm, 2 fingers below the umbilicus Lochia: moderate, rubra DVT Evaluation: no evidence of DVT Extremities: no edema, no calf tenderness  UOP: voiding spontaneously  Recent Labs    02/22/19 1358 02/23/19 1331  HGB 8.9* 9.4*  HCT 29.2* 29.4*    Assessment/Plan: Patient Active Problem List   Diagnosis Date Noted  . Gestational hypertension 02/24/2019  . Placental abruption 02/24/2019  . Post-dates pregnancy 02/22/2019  . Macrosomia of fetus affecting management of mother in singleton pregnancy in third trimester 01/23/2019  . Supervision of normal first pregnancy, antepartum 10/18/2018    Patient is 19 y.o. G1P1001 POD#1 s/p 1LTCS at [redacted]w[redacted]d for arrest of dilation, elective. Course complicated by intubation of infant, however patient is doing well, recovering appropriately and  complains only of heavy bleeding and pain.   Continue routine post partum care Pain meds prn Regular diet Lovenox 40 mg daily [place] POPs for birth control Plan for discharge possibly tomorrow    K. [redacted]w[redacted]d, M.D. [facility]  02/25/2019, 7:48 AM

## 2019-02-26 LAB — SURGICAL PATHOLOGY

## 2019-02-26 MED ORDER — IBUPROFEN 800 MG PO TABS: 800.0000 mg | ORAL_TABLET | Freq: Three times a day (TID) | ORAL | 0 refills | Status: DC

## 2019-02-26 MED ORDER — OXYCODONE-ACETAMINOPHEN 5-325 MG PO TABS: 1.0000 | ORAL_TABLET | Freq: Four times a day (QID) | ORAL | 0 refills | Status: DC | PRN

## 2019-02-26 NOTE — Progress Notes (Signed)
Pt discharged after d/c instructions and prescription given. Pt d/c via wheelchair in stable condition. All belongings sent with patient.

## 2019-02-26 NOTE — Discharge Instructions (Signed)
Postpartum Hypertension Postpartum hypertension is high blood pressure that remains higher than normal after childbirth. You may not realize that you have postpartum hypertension if your blood pressure is not being checked regularly. In most cases, postpartum hypertension will go away on its own, usually within a week of delivery. However, for some women, medical treatment is required to prevent serious complications, such as seizures or stroke. What are the causes? This condition may be caused by one or more of the following:  Hypertension that existed before pregnancy (chronic hypertension).  Hypertension that comes on as a result of pregnancy (gestational hypertension).  Hypertensive disorders during pregnancy (preeclampsia) or seizures in women who have high blood pressure during pregnancy (eclampsia).  A condition in which the liver, platelets, and red blood cells are damaged during pregnancy (HELLP syndrome).  A condition in which the thyroid produces too much hormones (hyperthyroidism).  Other rare problems of the nerves (neurological disorders) or blood disorders. In some cases, the cause may not be known. What increases the risk? The following factors may make you more likely to develop this condition:  Chronic hypertension. In some cases, this may not have been diagnosed before pregnancy.  Obesity.  Type 2 diabetes.  Kidney disease.  History of preeclampsia or eclampsia.  Other medical conditions that change the level of hormones in the body (hormonal imbalance). What are the signs or symptoms? As with all types of hypertension, postpartum hypertension may not have any symptoms. Depending on how high your blood pressure is, you may experience:  Headaches. These may be mild, moderate, or severe. They may also be steady, constant, or sudden in onset (thunderclap headache).  Changes in your ability to see (visual changes).  Dizziness.  Shortness of breath.  Swelling  of your hands, feet, lower legs, or face. In some cases, you may have swelling in more than one of these locations.  Heart palpitations or a racing heartbeat.  Difficulty breathing while lying down.  Decrease in the amount of urine that you pass. Other rare signs and symptoms may include:  Sweating more than usual. This lasts longer than a few days after delivery.  Chest pain.  Sudden dizziness when you get up from sitting or lying down.  Seizures.  Nausea or vomiting.  Abdominal pain. How is this diagnosed? This condition may be diagnosed based on the results of a physical exam, blood pressure measurements, and blood and urine tests. You may also have other tests, such as a CT scan or an MRI, to check for other problems of postpartum hypertension. How is this treated? If blood pressure is high enough to require treatment, your options may include:  Medicines to reduce blood pressure (antihypertensives). Tell your health care provider if you are breastfeeding or if you plan to breastfeed. There are many antihypertensive medicines that are safe to take while breastfeeding.  Stopping medicines that may be causing hypertension.  Treating medical conditions that are causing hypertension.  Treating the complications of hypertension, such as seizures, stroke, or kidney problems. Your health care provider will also continue to monitor your blood pressure closely until it is within a safe range for you. Follow these instructions at home:  Take over-the-counter and prescription medicines only as told by your health care provider.  Return to your normal activities as told by your health care provider. Ask your health care provider what activities are safe for you.  Do not use any products that contain nicotine or tobacco, such as cigarettes and e-cigarettes. If   you need help quitting, ask your health care provider.  Keep all follow-up visits as told by your health care provider. This  is important. Contact a health care provider if:  Your symptoms get worse.  You have new symptoms, such as: ? A headache that does not get better. ? Dizziness. ? Visual changes. Get help right away if:  You suddenly develop swelling in your hands, ankles, or face.  You have sudden, rapid weight gain.  You develop difficulty breathing, chest pain, racing heartbeat, or heart palpitations.  You develop severe pain in your abdomen.  You have any symptoms of a stroke. "BE FAST" is an easy way to remember the main warning signs of a stroke: ? B - Balance. Signs are dizziness, sudden trouble walking, or loss of balance. ? E - Eyes. Signs are trouble seeing or a sudden change in vision. ? F - Face. Signs are sudden weakness or numbness of the face, or the face or eyelid drooping on one side. ? A - Arms. Signs are weakness or numbness in an arm. This happens suddenly and usually on one side of the body. ? S - Speech. Signs are sudden trouble speaking, slurred speech, or trouble understanding what people say. ? T - Time. Time to call emergency services. Write down what time symptoms started.  You have other signs of a stroke, such as: ? A sudden, severe headache with no known cause. ? Nausea or vomiting. ? Seizure. These symptoms may represent a serious problem that is an emergency. Do not wait to see if the symptoms will go away. Get medical help right away. Call your local emergency services (911 in the [place] Do not drive yourself to the hospital. Summary  Postpartum hypertension is high blood pressure that remains higher than normal after childbirth.  In most cases, postpartum hypertension will go away on its own, usually within a week of delivery.  For some women, medical treatment is required to prevent serious complications, such as seizures or stroke. This information is not intended to replace advice given to you by your health care provider. Make sure you discuss any questions  you have with your health care provider. Document Revised: 03/16/2018 Document Reviewed: 11/28/2016 Elsevier Patient Education  2020 [employer] Cesarean Delivery, Care After This sheet gives you information about how to care for yourself after your procedure. Your health care provider may also give you more specific instructions. If you have problems or questions, contact your health care provider. What can I expect after the procedure? After the procedure, it is common to have:  A small amount of blood or clear fluid coming from the incision.  Some redness, swelling, and pain in your incision area.  Some abdominal pain and soreness.  Vaginal bleeding (lochia). Even though you did not have a vaginal delivery, you will still have vaginal bleeding and discharge.  Pelvic cramps.  Fatigue. You may have pain, swelling, and discomfort in the tissue between your vagina and your anus (perineum) if:  Your C-section was unplanned, and you were allowed to labor and push.  An incision was made in the area (episiotomy) or the tissue tore during attempted vaginal delivery. Follow these instructions at home: Incision care   Follow instructions from your health care provider about how to take care of your incision. Make sure you: ? Wash your hands with soap and water before you change your bandage (dressing). If soap and water are not available, use hand sanitizer. ? If you  have a dressing, change it or remove it as told by your health care provider. ? Leave stitches (sutures), skin staples, skin glue, or adhesive strips in place. These skin closures may need to stay in place for 2 weeks or longer. If adhesive strip edges start to loosen and curl up, you may trim the loose edges. Do not remove adhesive strips completely unless your health care provider tells you to do that.  Check your incision area every day for signs of infection. Check for: ? More redness, swelling, or pain. ? More fluid or  blood. ? Warmth. ? Pus or a bad smell.  Do not take baths, swim, or use a hot tub until your health care provider says it's okay. Ask your health care provider if you can take showers.  When you cough or sneeze, hug a pillow. This helps with pain and decreases the chance of your incision opening up (dehiscing). Do this until your incision heals. Medicines  Take over-the-counter and prescription medicines only as told by your health care provider.  If you were prescribed an antibiotic medicine, take it as told by your health care provider. Do not stop taking the antibiotic even if you start to feel better.  Do not drive or use heavy machinery while taking prescription pain medicine. Lifestyle  Do not drink alcohol. This is especially important if you are breastfeeding or taking pain medicine.  Do not use any products that contain nicotine or tobacco, such as cigarettes, e-cigarettes, and chewing tobacco. If you need help quitting, ask your health care provider. Eating and drinking  Drink at least 8 eight-ounce glasses of water every day unless told not to by your health care provider. If you breastfeed, you may need to drink even more water.  Eat high-fiber foods every day. These foods may help prevent or relieve constipation. High-fiber foods include: ? Whole grain cereals and breads. ? Brown rice. ? Beans. ? Fresh fruits and vegetables. Activity   If possible, have someone help you care for your baby and help with household activities for at least a few days after you leave the hospital.  Return to your normal activities as told by your health care provider. Ask your health care provider what activities are safe for you.  Rest as much as possible. Try to rest or take a nap while your baby is sleeping.  Do not lift anything that is heavier than 10 lbs (4.5 kg), or the limit that you were told, until your health care provider says that it is safe.  Talk with your health care  provider about when you can engage in sexual activity. This may depend on your: ? Risk of infection. ? How fast you heal. ? Comfort and desire to engage in sexual activity. General instructions  Do not use tampons or douches until your health care provider approves.  Wear loose, comfortable clothing and a supportive and well-fitting bra.  Keep your perineum clean and dry. Wipe from front to back when you use the toilet.  If you pass a blood clot, save it and call your health care provider to discuss. Do not flush blood clots down the toilet before you get instructions from your health care provider.  Keep all follow-up visits for you and your baby as told by your health care provider. This is important. Contact a health care provider if:  You have: ? A fever. ? Bad-smelling vaginal discharge. ? Pus or a bad smell coming from your incision. ?   Difficulty or pain when urinating. ? A sudden increase or decrease in the frequency of your bowel movements. ? More redness, swelling, or pain around your incision. ? More fluid or blood coming from your incision. ? A rash. ? Nausea. ? Little or no interest in activities you used to enjoy. ? Questions about caring for yourself or your baby.  Your incision feels warm to the touch.  Your breasts turn red or become painful or hard.  You feel unusually sad or worried.  You vomit.  You pass a blood clot from your vagina.  You urinate more than usual.  You are dizzy or light-headed. Get help right away if:  You have: ? Pain that does not go away or get better with medicine. ? Chest pain. ? Difficulty breathing. ? Blurred vision or spots in your vision. ? Thoughts about hurting yourself or your baby. ? New pain in your abdomen or in one of your legs. ? A severe headache.  You faint.  You bleed from your vagina so much that you fill more than one sanitary pad in one hour. Bleeding should not be heavier than your heaviest  period. Summary  After the procedure, it is common to have pain at your incision site, abdominal cramping, and slight bleeding from your vagina.  Check your incision area every day for signs of infection.  Tell your health care provider about any unusual symptoms.  Keep all follow-up visits for you and your baby as told by your health care provider. This information is not intended to replace advice given to you by your health care provider. Make sure you discuss any questions you have with your health care provider. Document Revised: 08/16/2017 Document Reviewed: 08/16/2017 Elsevier Patient Education  [place].

## 2019-02-26 NOTE — Lactation Note (Signed)
This note was copied from a baby's chart. Lactation Consultation Note  Patient Name: Shannon Hogan Shannon Hogan Date: 02/26/2019 Reason for consult: (P) Follow-up assessment  Infant dimples when at breast; Mom needing help with alignment. Even after assisting with alignment/changing positions, there were no signs of transferring milk at breast. Supplementing at breast was attempted, but without success. As infant was hungry & 5 Fr/syringe were ready, Dad was taught how to finger-feed. Infant did well.   Plan for home: 1. Offer breast. If no swallows detected or infant not content, feed EBM/formula with bottle. Tommee [place] bottle with size 1 nipple noted in room. I recommended size 0, instead. Parents also have Dr. Saul Hogan at home. I suggested the Newborn nipple if using that bottle.   2. Pump after breastfeeding attempts or when infant gets formula. Mom has a [employer] pump at home. Mom is using size 24 flanges here, which appear appropriate. Mom shown how to assemble & use hand pump (single- & double-mode) that was included in pump kit. Parents know about washing pump parts/sanitizing pump parts, etc.   Mom's questions about milk coming to volume, detecting swallows, & output answered. Mom was also shown output chart in discharge book.   Feeding frequency "watch baby, not clock" discussed.   Shannon Hogan [facility] 02/26/2019, 8:48 AM

## 2019-02-27 ENCOUNTER — Other Ambulatory Visit (HOSPITAL_COMMUNITY): Payer: Medicaid Other

## 2019-02-27 ENCOUNTER — Other Ambulatory Visit: Payer: Medicaid Other

## 2019-03-01 ENCOUNTER — Inpatient Hospital Stay (HOSPITAL_COMMUNITY)
Admission: AD | Admit: 2019-03-01 | Payer: Medicaid Other | Source: Home / Self Care | Admitting: Obstetrics and Gynecology

## 2019-03-01 ENCOUNTER — Inpatient Hospital Stay (HOSPITAL_COMMUNITY)
Admission: RE | Admit: 2019-03-01 | Payer: Medicaid Other | Source: Home / Self Care | Admitting: Obstetrics and Gynecology

## 2019-03-01 ENCOUNTER — Inpatient Hospital Stay (HOSPITAL_COMMUNITY): Payer: Medicaid Other

## 2019-03-11 ENCOUNTER — Encounter: Payer: Self-pay | Admitting: Anesthesiology

## 2019-03-11 NOTE — Addendum Note (Signed)
Addendum  created 03/11/19 1149 by Bethena Midget, MD   Intraprocedure Event edited

## 2019-03-14 ENCOUNTER — Encounter: Payer: Self-pay | Admitting: Obstetrics and Gynecology

## 2019-03-14 ENCOUNTER — Telehealth (INDEPENDENT_AMBULATORY_CARE_PROVIDER_SITE_OTHER): Payer: Medicaid Other | Admitting: Obstetrics and Gynecology

## 2019-03-14 ENCOUNTER — Other Ambulatory Visit: Payer: Self-pay

## 2019-03-14 VITALS — BP 122/84 | HR 81 | Ht 62.0 in

## 2019-03-14 DIAGNOSIS — Z4889 Encounter for other specified surgical aftercare: Secondary | ICD-10-CM

## 2019-03-14 DIAGNOSIS — Z9889 Other specified postprocedural states: Secondary | ICD-10-CM

## 2019-03-14 NOTE — Progress Notes (Signed)
     MY CHART VIDEO POSTPARTUM VISIT ENCOUNTER NOTE  I connected with Shannon Hogan  on 03/19/19 at 10:10 AM EST by My Chart video at home and verified that I am speaking with the correct person using two identifiers.   I discussed the limitations, risks, security and privacy concerns of performing an evaluation and management service by My Chart video and the availability of in person appointments. I also discussed with the patient that there may be a patient responsible charge related to this service. The patient expressed understanding and agreed to proceed.  Subjective:     Shannon Hogan is a 19 y.o. female who presents via My Chart video 2 weeks status post cesarean delivery for failure to progress, arrested decent of fetal head and suspected placental abruption. Eating a regular diet without difficulty. Bowel movements are normal. The patient is not having any pain.  The following portions of the patient's history were reviewed and updated as appropriate: allergies, current medications, past family history, past medical history, past social history, past surgical history and problem list.  Review of Systems Pertinent items are noted in HPI.    Objective:    BP 122/84 (BP Location: Left Arm, Patient Position: Sitting, Cuff Size: Normal)   Pulse 81   Ht 5\' 2"  (1.575 m)   LMP 05/06/2018 (Exact Date)   BMI 31.06 kg/m  General:  alert, cooperative and no distress  Abdomen:  not able to assess due to nature of visit  Incision:   healing well, no drainage, no erythema, no hernia, no seroma, no swelling, no dehiscence, incision well approximated - assessed via camera on patient's cell phone     Assessment:    Doing well postoperatively.   Plan:    1. Continue any current medications. 2. Wound care discussed. 3. Activity restrictions: no lifting more than 25 pounds 4. Anticipated return to work: 4 weeks. 5. Follow up: 4 weeks for Postpartum visit.   05/08/2018,  CNM 03/14/2019 10:23 AM

## 2019-04-08 ENCOUNTER — Telehealth: Payer: Medicaid Other | Admitting: Obstetrics and Gynecology

## 2019-04-11 ENCOUNTER — Telehealth: Payer: Medicaid Other | Admitting: Obstetrics and Gynecology

## 2019-05-10 ENCOUNTER — Other Ambulatory Visit: Payer: Self-pay

## 2019-05-10 ENCOUNTER — Encounter: Payer: Medicaid Other | Admitting: Licensed Clinical Social Worker

## 2019-05-10 ENCOUNTER — Ambulatory Visit (INDEPENDENT_AMBULATORY_CARE_PROVIDER_SITE_OTHER): Payer: Medicaid Other | Admitting: Medical

## 2019-05-10 VITALS — BP 129/77 | HR 91 | Temp 97.8°F | Ht 62.0 in | Wt 146.0 lb

## 2019-05-10 DIAGNOSIS — Z8759 Personal history of other complications of pregnancy, childbirth and the puerperium: Secondary | ICD-10-CM

## 2019-05-10 DIAGNOSIS — Z3042 Encounter for surveillance of injectable contraceptive: Secondary | ICD-10-CM | POA: Diagnosis not present

## 2019-05-10 DIAGNOSIS — D649 Anemia, unspecified: Secondary | ICD-10-CM

## 2019-05-10 DIAGNOSIS — O99013 Anemia complicating pregnancy, third trimester: Secondary | ICD-10-CM | POA: Insufficient documentation

## 2019-05-10 DIAGNOSIS — Z98891 History of uterine scar from previous surgery: Secondary | ICD-10-CM | POA: Insufficient documentation

## 2019-05-10 DIAGNOSIS — F53 Postpartum depression: Secondary | ICD-10-CM | POA: Insufficient documentation

## 2019-05-10 DIAGNOSIS — O99345 Other mental disorders complicating the puerperium: Secondary | ICD-10-CM

## 2019-05-10 DIAGNOSIS — Z3202 Encounter for pregnancy test, result negative: Secondary | ICD-10-CM | POA: Diagnosis not present

## 2019-05-10 HISTORY — DX: Personal history of other complications of pregnancy, childbirth and the puerperium: Z87.59

## 2019-05-10 LAB — POCT URINE PREGNANCY: Preg Test, Ur: NEGATIVE

## 2019-05-10 MED ORDER — MEDROXYPROGESTERONE ACETATE 150 MG/ML IM SUSP
150.0000 mg | INTRAMUSCULAR | Status: AC
  Administered 2019-05-10 – 2019-07-29 (×2): 150 mg via INTRAMUSCULAR

## 2019-05-10 NOTE — Patient Instructions (Signed)
Anemia  Anemia is a condition in which you do not have enough red blood cells or hemoglobin. Hemoglobin is a substance in red blood cells that carries oxygen. When you do not have enough red blood cells or hemoglobin (are anemic), your body cannot get enough oxygen and your organs may not work properly. As a result, you may feel very tired or have other problems. What are the causes? Common causes of anemia include:  Excessive bleeding. Anemia can be caused by excessive bleeding inside or outside the body, including bleeding from the intestine or from periods in women.  Poor nutrition.  Long-lasting (chronic) kidney, thyroid, and liver disease.  Bone marrow disorders.  Cancer and treatments for cancer.  HIV (human immunodeficiency virus) and AIDS (acquired immunodeficiency syndrome).  Treatments for HIV and AIDS.  Spleen problems.  Blood disorders.  Infections, medicines, and autoimmune disorders that destroy red blood cells. What are the signs or symptoms? Symptoms of this condition include:  Minor weakness.  Dizziness.  Headache.  Feeling heartbeats that are irregular or faster than normal (palpitations).  Shortness of breath, especially with exercise.  Paleness.  Cold sensitivity.  Indigestion.  Nausea.  Difficulty sleeping.  Difficulty concentrating. Symptoms may occur suddenly or develop slowly. If your anemia is mild, you may not have symptoms. How is this diagnosed? This condition is diagnosed based on:  Blood tests.  Your medical history.  A physical exam.  Bone marrow biopsy. Your health care provider may also check your stool (feces) for blood and may do additional testing to look for the cause of your bleeding. You may also have other tests, including:  Imaging tests, such as a CT scan or MRI.  Endoscopy.  Colonoscopy. How is this treated? Treatment for this condition depends on the cause. If you continue to lose a lot of blood, you may  need to be treated at a hospital. Treatment may include:  Taking supplements of iron, vitamin S31, or folic acid.  Taking a hormone medicine (erythropoietin) that can help to stimulate red blood cell growth.  Having a blood transfusion. This may be needed if you lose a lot of blood.  Making changes to your diet.  Having surgery to remove your spleen. Follow these instructions at home:  Take over-the-counter and prescription medicines only as told by your health care provider.  Take supplements only as told by your health care provider.  Follow any diet instructions that you were given.  Keep all follow-up visits as told by your health care provider. This is important. Contact a health care provider if:  You develop new bleeding anywhere in the body. Get help right away if:  You are very weak.  You are short of breath.  You have pain in your abdomen or chest.  You are dizzy or feel faint.  You have trouble concentrating.  You have bloody or black, tarry stools.  You vomit repeatedly or you vomit up blood. Summary  Anemia is a condition in which you do not have enough red blood cells or enough of a substance in your red blood cells that carries oxygen (hemoglobin).  Symptoms may occur suddenly or develop slowly.  If your anemia is mild, you may not have symptoms.  This condition is diagnosed with blood tests as well as a medical history and physical exam. Other tests may be needed.  Treatment for this condition depends on the cause of the anemia. This information is not intended to replace advice given to you by  your health care provider. Make sure you discuss any questions you have with your health care provider. Document Revised: 01/20/2017 Document Reviewed: 03/11/2016 Elsevier Patient Education  [place]. Cesarean Delivery, Care After This sheet gives you information about how to care for yourself after your procedure. Your health care provider may  also give you more specific instructions. If you have problems or questions, contact your health care provider. What can I expect after the procedure? After the procedure, it is common to have:  A small amount of blood or clear fluid coming from the incision.  Some redness, swelling, and pain in your incision area.  Some abdominal pain and soreness.  Vaginal bleeding (lochia). Even though you did not have a vaginal delivery, you will still have vaginal bleeding and discharge.  Pelvic cramps.  Fatigue. You may have pain, swelling, and discomfort in the tissue between your vagina and your anus (perineum) if:  Your C-section was unplanned, and you were allowed to labor and push.  An incision was made in the area (episiotomy) or the tissue tore during attempted vaginal delivery. Follow these instructions at home: Incision care   Follow instructions from your health care provider about how to take care of your incision. Make sure you: ? Wash your hands with soap and water before you change your bandage (dressing). If soap and water are not available, use hand sanitizer. ? If you have a dressing, change it or remove it as told by your health care provider. ? Leave stitches (sutures), skin staples, skin glue, or adhesive strips in place. These skin closures may need to stay in place for 2 weeks or longer. If adhesive strip edges start to loosen and curl up, you may trim the loose edges. Do not remove adhesive strips completely unless your health care provider tells you to do that.  Check your incision area every day for signs of infection. Check for: ? More redness, swelling, or pain. ? More fluid or blood. ? Warmth. ? Pus or a bad smell.  Do not take baths, swim, or use a hot tub until your health care provider says it's okay. Ask your health care provider if you can take showers.  When you cough or sneeze, hug a pillow. This helps with pain and decreases the chance of your incision  opening up (dehiscing). Do this until your incision heals. Medicines  Take over-the-counter and prescription medicines only as told by your health care provider.  If you were prescribed an antibiotic medicine, take it as told by your health care provider. Do not stop taking the antibiotic even if you start to feel better.  Do not drive or use heavy machinery while taking prescription pain medicine. Lifestyle  Do not drink alcohol. This is especially important if you are breastfeeding or taking pain medicine.  Do not use any products that contain nicotine or tobacco, such as cigarettes, e-cigarettes, and chewing tobacco. If you need help quitting, ask your health care provider. Eating and drinking  Drink at least 8 eight-ounce glasses of water every day unless told not to by your health care provider. If you breastfeed, you may need to drink even more water.  Eat high-fiber foods every day. These foods may help prevent or relieve constipation. High-fiber foods include: ? Whole grain cereals and breads. ? Brown rice. ? Beans. ? Fresh fruits and vegetables. Activity   If possible, have someone help you care for your baby and help with household activities for at least  a few days after you leave the hospital.  Return to your normal activities as told by your health care provider. Ask your health care provider what activities are safe for you.  Rest as much as possible. Try to rest or take a nap while your baby is sleeping.  Do not lift anything that is heavier than 10 lbs (4.5 kg), or the limit that you were told, until your health care provider says that it is safe.  Talk with your health care provider about when you can engage in sexual activity. This may depend on your: ? Risk of infection. ? How fast you heal. ? Comfort and desire to engage in sexual activity. General instructions  Do not use tampons or douches until your health care provider approves.  Wear loose,  comfortable clothing and a supportive and well-fitting bra.  Keep your perineum clean and dry. Wipe from front to back when you use the toilet.  If you pass a blood clot, save it and call your health care provider to discuss. Do not flush blood clots down the toilet before you get instructions from your health care provider.  Keep all follow-up visits for you and your baby as told by your health care provider. This is important. Contact a health care provider if:  You have: ? A fever. ? Bad-smelling vaginal discharge. ? Pus or a bad smell coming from your incision. ? Difficulty or pain when urinating. ? A sudden increase or decrease in the frequency of your bowel movements. ? More redness, swelling, or pain around your incision. ? More fluid or blood coming from your incision. ? A rash. ? Nausea. ? Little or no interest in activities you used to enjoy. ? Questions about caring for yourself or your baby.  Your incision feels warm to the touch.  Your breasts turn red or become painful or hard.  You feel unusually sad or worried.  You vomit.  You pass a blood clot from your vagina.  You urinate more than usual.  You are dizzy or light-headed. Get help right away if:  You have: ? Pain that does not go away or get better with medicine. ? Chest pain. ? Difficulty breathing. ? Blurred vision or spots in your vision. ? Thoughts about hurting yourself or your baby. ? New pain in your abdomen or in one of your legs. ? A severe headache.  You faint.  You bleed from your vagina so much that you fill more than one sanitary pad in one hour. Bleeding should not be heavier than your heaviest period. Summary  After the procedure, it is common to have pain at your incision site, abdominal cramping, and slight bleeding from your vagina.  Check your incision area every day for signs of infection.  Tell your health care provider about any unusual symptoms.  Keep all follow-up  visits for you and your baby as told by your health care provider. This information is not intended to replace advice given to you by your health care provider. Make sure you discuss any questions you have with your health care provider. Document Revised: 08/16/2017 Document Reviewed: 08/16/2017 Elsevier Patient Education  [place].

## 2019-05-10 NOTE — Progress Notes (Signed)
   Subjective:  Pt in for Depo Provera injection.    Objective: Need for contraception. No unusual complaints.    Assessment: Pt tolerated Depo injection. Depo given Left Deltoid.   Plan:  Next injection due June 4-18, 2021.    Clovis Pu, RN

## 2019-05-10 NOTE — Progress Notes (Signed)
     Post Partum Visit Note  Shannon Hogan is a 19 y.o. G37P1001 female who presents for a postpartum visit. She is 9 weeks postpartum following a primary cesarean section.  I have fully reviewed the prenatal and intrapartum course. The delivery was at 40 gestational weeks.  Anesthesia: epidural. Postpartum course has been normal. Baby is doing well normal. Baby is feeding by bottle Rush Barer. Bleeding Started menses 05/06/19. Bowel function is normal. Bladder function is normal. Patient is sexually active. Contraception method is desires Depo Provera. Postpartum depression screening: positive: score 15  The following portions of the patient's history were reviewed and updated as appropriate: allergies, current medications, past family history, past medical history, past social history, past surgical history and problem list.  Review of Systems Pertinent items are noted in HPI.    Objective:  Blood pressure 129/77, pulse 91, temperature 97.8 F (36.6 C), temperature source Oral, height 5\' 2"  (1.575 m), weight 146 lb (66.2 kg), last menstrual period 05/06/2019, not currently breastfeeding.  General:  alert and cooperative   Breasts:  not performed  Lungs: clear to auscultation bilaterally  Heart:  regular rate and rhythm, S1, S2 normal, no murmur, click, rub or gallop  Abdomen: soft, non-tender; bowel sounds normal; no masses,  no organomegaly incision is well-healed, non-tender.    Vulva:  not evaluated  Vagina: not evaluated  Cervix:  not evaluated  Corpus: not examined  Adnexa:  not evaluated  Rectal Exam: Not performed.        Assessment:    Normal postpartum exam. Pap smear not done at today's visit. Pap smear due at age 46 years.   Plan:   Essential components of care per ACOG recommendations:  1.  Mood and well being: Patient with positive depression screening today. Reviewed local resources for support. Patient scheduled to see [facility] next week.  - Patient does not use  tobacco.  - hx of drug use? Yes  Denies use since prior to pregnancy.   2. Infant care and feeding:  -Patient currently breastmilk feeding? No  -Social determinants of health (SDOH) reviewed in EPIC. No concerns  3. Sexuality, contraception and birth spacing - Patient does not want a pregnancy in the next year. - Reviewed forms of contraception in tiered fashion. Patient desired Depo-Provera today.   - Discussed birth spacing of 18 months  4. Sleep and fatigue -Encouraged family/partner/community support of 4 hrs of uninterrupted sleep to help with mood and fatigue  5. Physical Recovery  - Discussed patients delivery by C/S and complications. C/S performed for suspected placental abruption  - Patient has urinary incontinence? No - Patient is safe to resume physical and sexual activity  6.  Health Maintenance - No pap smear history due to age   14. History of GHTN - Normotensive prior to discharge PP and did not need anti-hypertensives after delivery - Normotensive today   8. Anemia - Fereheme prior to discharge from hospital  - Last CBC = Hgb 6.9 - Repeat CBC today    9, PA-C [facility], [facility]

## 2019-05-11 LAB — CBC
Hematocrit: 37.1 % (ref 34.0–46.6)
Hemoglobin: 11.8 g/dL (ref 11.1–15.9)
MCH: 25.6 pg — ABNORMAL LOW (ref 26.6–33.0)
MCHC: 31.8 g/dL (ref 31.5–35.7)
MCV: 81 fL (ref 79–97)
Platelets: 249 10*3/uL (ref 150–450)
RBC: 4.61 x10E6/uL (ref 3.77–5.28)
RDW: 16 % — ABNORMAL HIGH (ref 11.7–15.4)
WBC: 7.7 10*3/uL (ref 3.4–10.8)

## 2019-05-15 ENCOUNTER — Ambulatory Visit: Payer: Medicaid Other | Admitting: Licensed Clinical Social Worker

## 2019-05-15 NOTE — BH Specialist Note (Signed)
Called pt twice unable to leave message. Called grandmother ph number [phone] unable to leave message. Mychart link sent pt no showed appt

## 2019-05-23 ENCOUNTER — Emergency Department (HOSPITAL_COMMUNITY)
Admission: EM | Admit: 2019-05-23 | Discharge: 2019-05-23 | Disposition: A | Discharge status: Home / Self Care | Payer: Medicaid Other | Attending: Emergency Medicine | Admitting: Emergency Medicine

## 2019-05-23 ENCOUNTER — Encounter (HOSPITAL_COMMUNITY): Payer: Self-pay | Admitting: Emergency Medicine

## 2019-05-23 ENCOUNTER — Other Ambulatory Visit: Payer: Self-pay

## 2019-05-23 ENCOUNTER — Ambulatory Visit (INDEPENDENT_AMBULATORY_CARE_PROVIDER_SITE_OTHER): Payer: Medicaid Other | Admitting: Licensed Clinical Social Worker

## 2019-05-23 DIAGNOSIS — S060X0A Concussion without loss of consciousness, initial encounter: Secondary | ICD-10-CM | POA: Insufficient documentation

## 2019-05-23 DIAGNOSIS — S161XXA Strain of muscle, fascia and tendon at neck level, initial encounter: Secondary | ICD-10-CM | POA: Diagnosis not present

## 2019-05-23 DIAGNOSIS — Y999 Unspecified external cause status: Secondary | ICD-10-CM | POA: Diagnosis not present

## 2019-05-23 DIAGNOSIS — F4322 Adjustment disorder with anxiety: Secondary | ICD-10-CM | POA: Diagnosis not present

## 2019-05-23 DIAGNOSIS — Y93I9 Activity, other involving external motion: Secondary | ICD-10-CM | POA: Insufficient documentation

## 2019-05-23 DIAGNOSIS — Y92410 Unspecified street and highway as the place of occurrence of the external cause: Secondary | ICD-10-CM | POA: Diagnosis not present

## 2019-05-23 DIAGNOSIS — S0990XA Unspecified injury of head, initial encounter: Secondary | ICD-10-CM | POA: Diagnosis present

## 2019-05-23 MED ORDER — METHOCARBAMOL 500 MG PO TABS: 500.0000 mg | ORAL_TABLET | Freq: Two times a day (BID) | ORAL | 0 refills | Status: DC
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
## 2019-05-23 NOTE — Discharge Instructions (Addendum)
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
## 2019-05-27 NOTE — BH Specialist Note (Signed)
Integrated Behavioral Health Initial Visit  MRN: [id number] Name: Shannon Hogan  Number of Integrated Behavioral Health Clinician visits:: 1 Session Start time: 9:55am  Session End time: 10:10pm Total time: 20 mins  Type of Service: Integrated Behavioral Health- Individual Interpretor:no  Interpretor Name and Language: None    Warm Hand Off Completed.       SUBJECTIVE: Shannon Hogan is a 19 y.o. female accompanied by n/a Patient was referred by Harlon Flor for post partum screening score 15 . Patient reports the following symptoms/concerns: sadness, loss of interest, and worry  Duration of problem: approx one year ; Severity of problem: mild   OBJECTIVE: Mood:  and Affect: good and congruent  Risk of harm to self or others: No risk of harm to self or others   LIFE CONTEXT: Family and Social: Lives in [place] with immediate family  School/Work: Therapist, sports  Self-Care: n/a Life Changes: newborn   GOALS ADDRESSED: Patient will: 1. Reduce symptoms of: psychosocial stressors, irritability  2. Increase knowledge and/or ability of: identify areas of stress   3. Demonstrate ability to: self manage symptoms   INTERVENTIONS: Interventions utilized: supportive and solution focus therapy   Standardized Assessments completed: edinburgh completed 05/10/2019  ASSESSMENT: Patient currently experiencing adjustment disorder w anxiety.   Patient may benefit from integrated behavioral health  PLAN: 1. Follow up with behavioral health clinician on : two weeks  2. Behavioral recommendations: follow up with [facility], positive self affirmations, increase social interaction by joining support groups 3. Referral(s): [facility] workforce development 4. "From scale of 1-10, how likely are you to follow plan?":   Gwyndolyn Saxon, LCSW

## 2019-06-06 ENCOUNTER — Ambulatory Visit: Payer: Medicaid Other | Admitting: Licensed Clinical Social Worker

## 2019-06-06 ENCOUNTER — Telehealth: Payer: Self-pay | Admitting: Licensed Clinical Social Worker

## 2019-06-06 NOTE — Telephone Encounter (Signed)
Called patient regarding scheduled appointment. Left detailed message for callback

## 2019-06-25 ENCOUNTER — Ambulatory Visit: Payer: Medicaid Other

## 2019-07-15 ENCOUNTER — Other Ambulatory Visit: Payer: Self-pay

## 2019-07-15 ENCOUNTER — Ambulatory Visit (INDEPENDENT_AMBULATORY_CARE_PROVIDER_SITE_OTHER): Payer: Medicaid Other | Admitting: *Deleted

## 2019-07-15 ENCOUNTER — Other Ambulatory Visit (HOSPITAL_COMMUNITY)
Admission: RE | Admit: 2019-07-15 | Discharge: 2019-07-15 | Disposition: A | Discharge status: Home / Self Care | Payer: Medicaid Other | Source: Ambulatory Visit | Attending: Obstetrics and Gynecology | Admitting: Obstetrics and Gynecology

## 2019-07-15 VITALS — BP 118/70 | HR 101 | Temp 98.8°F | Ht 62.0 in | Wt 136.8 lb

## 2019-07-15 DIAGNOSIS — N898 Other specified noninflammatory disorders of vagina: Secondary | ICD-10-CM | POA: Diagnosis not present

## 2019-07-15 DIAGNOSIS — Z113 Encounter for screening for infections with a predominantly sexual mode of transmission: Secondary | ICD-10-CM | POA: Diagnosis not present

## 2019-07-15 NOTE — Progress Notes (Signed)
   SUBJECTIVE:  19 y.o. female complains of odor, thick and brownish vaginal discharge for 2 week(s). Denies abnormal vaginal bleeding or significant pelvic pain or fever. No UTI symptoms. Denies history of known exposure to STD.  No LMP recorded. Patient has had an injection.  OBJECTIVE:  She appears well, afebrile. Urine dipstick: not done.  ASSESSMENT:  Vaginal Discharge  Vaginal Odor   PLAN:  GC, chlamydia, trichomonas, BVAG, CVAG probe sent to lab. Treatment: To be determined once lab results are received ROV prn if symptoms persist or worsen.  Clovis Pu, RN

## 2019-07-16 ENCOUNTER — Other Ambulatory Visit: Payer: Self-pay | Admitting: Obstetrics and Gynecology

## 2019-07-16 DIAGNOSIS — B9689 Other specified bacterial agents as the cause of diseases classified elsewhere: Secondary | ICD-10-CM

## 2019-07-16 DIAGNOSIS — N760 Acute vaginitis: Secondary | ICD-10-CM

## 2019-07-16 LAB — CERVICOVAGINAL ANCILLARY ONLY
Bacterial Vaginitis (gardnerella): POSITIVE — AB
Candida Glabrata: NEGATIVE
Candida Vaginitis: NEGATIVE
Chlamydia: NEGATIVE
Comment: NEGATIVE
Comment: NEGATIVE
Comment: NEGATIVE
Comment: NEGATIVE
Comment: NEGATIVE
Comment: NORMAL
Neisseria Gonorrhea: NEGATIVE
Trichomonas: NEGATIVE

## 2019-07-16 MED ORDER — METRONIDAZOLE 500 MG PO TABS: 500.0000 mg | ORAL_TABLET | Freq: Two times a day (BID) | ORAL | 0 refills | Status: DC

## 2019-07-29 ENCOUNTER — Encounter: Payer: Self-pay | Admitting: *Deleted

## 2019-07-29 ENCOUNTER — Other Ambulatory Visit: Payer: Self-pay

## 2019-07-29 ENCOUNTER — Ambulatory Visit (INDEPENDENT_AMBULATORY_CARE_PROVIDER_SITE_OTHER): Payer: Medicaid Other | Admitting: *Deleted

## 2019-07-29 VITALS — BP 114/67 | HR 97 | Temp 98.2°F | Ht 62.0 in | Wt 140.0 lb

## 2019-07-29 DIAGNOSIS — Z3042 Encounter for surveillance of injectable contraceptive: Secondary | ICD-10-CM | POA: Diagnosis not present

## 2019-07-29 NOTE — Progress Notes (Signed)
   Subjective:  Pt in for Depo Provera injection.    Objective: Need for contraception. No unusual complaints.    Assessment: Pt tolerated Depo injection. Depo given Right Deltoid.   Plan:  Next injection due August 23-Sept. 6, 2021.    Clovis Pu, RN

## 2019-08-22 ENCOUNTER — Other Ambulatory Visit: Payer: Self-pay

## 2019-08-22 ENCOUNTER — Other Ambulatory Visit (HOSPITAL_COMMUNITY)
Admission: RE | Admit: 2019-08-22 | Discharge: 2019-08-22 | Disposition: A | Discharge status: Home / Self Care | Payer: Medicaid Other | Source: Ambulatory Visit | Attending: Obstetrics and Gynecology | Admitting: Obstetrics and Gynecology

## 2019-08-22 ENCOUNTER — Encounter: Payer: Self-pay | Admitting: Obstetrics and Gynecology

## 2019-08-22 ENCOUNTER — Ambulatory Visit (INDEPENDENT_AMBULATORY_CARE_PROVIDER_SITE_OTHER): Payer: Medicaid Other | Admitting: Obstetrics and Gynecology

## 2019-08-22 VITALS — BP 115/71 | HR 92 | Temp 98.1°F | Ht 62.0 in | Wt 139.2 lb

## 2019-08-22 DIAGNOSIS — N898 Other specified noninflammatory disorders of vagina: Secondary | ICD-10-CM | POA: Diagnosis not present

## 2019-08-22 DIAGNOSIS — Z419 Encounter for procedure for purposes other than remedying health state, unspecified: Secondary | ICD-10-CM | POA: Diagnosis not present

## 2019-08-22 DIAGNOSIS — B372 Candidiasis of skin and nail: Secondary | ICD-10-CM | POA: Diagnosis not present

## 2019-08-22 MED ORDER — NYSTATIN 100000 UNIT/GM EX CREA: 1.0000 "application " | TOPICAL_CREAM | Freq: Two times a day (BID) | CUTANEOUS | 1 refills | Status: DC

## 2019-08-22 NOTE — Patient Instructions (Addendum)
Alternative Vaginitis Therapies  1) soak in tub of warm water waist high with 1/2 cup of baking soda in water for ~ 20 mins. 2) soak 3 tampons in 1 tablespoon of fractionated (liquid form) coconut oil with 10 drops of Melaleuca (Tea Tree) essential oil, insert 1 saturated tampon vaginally at bedtime x 3 days. Both options are to be done after sexual intercourse, menses and when suspects Bacterial Vaginosis and/or yeast infection. Please be advised that these alternatives will not replace the need to be evaluated, if symptoms persist. You will need to seek care at an OB/GYN provider.  GO WHITE: Soap: UNSCENTED Dove (white box light green writing) Laundry detergent (underwear)- Dreft or [employer] n[employer] unscented WHITE 100% cotton panties (NOT just cotton crouch) Sanitary napkin/panty liners: UNSCENTED.  If it doesn't SAY unscented it can have a scent/perfume    NO PERFUMES OR LOTIONS OR POTIONS in the vulvar area (may use regular [place]) Condoms: hypoallergenic only. Non dyed (no color) Toilet papers: white only Wash clothes: use a separate wash cloth. WHITE.  Wash in [place].   You can purchase Tea Tree Oil locally at:  [facility] [address] [place], [place] [address] [phone]  [facility] [address] [place], [place] [address] [phone]  We will treat according to your vaginal swab results, but please treat the yeast on your skin with the medication sent to your pharmacy.   Skin Yeast Infection  A skin yeast infection is a condition in which there is an overgrowth of yeast (candida) that normally lives on the skin. This condition usually occurs in areas of the skin that are constantly warm and moist, such as the armpits or the groin. What are the causes? This condition is caused by a change in the normal balance of the yeast and bacteria that live on the skin. What increases the risk? You are more likely to develop this condition if you:  Are  obese.  Are pregnant.  Take birth control pills.  Have diabetes.  Take antibiotic medicines.  Take steroid medicines.  Are malnourished.  Have a weak body defense system (immune system).  Are 26 years of age or older.  Wear tight clothing. What are the signs or symptoms? The most common symptom of this condition is itchiness in the affected area. Other symptoms include:  Red, swollen area of the skin.  Bumps on the skin. How is this diagnosed?  This condition is diagnosed with a medical history and physical exam.  Your health care provider may check for yeast by taking light scrapings of the skin to be viewed under a microscope. How is this treated? This condition is treated with medicine. Medicines may be prescribed or be available over the counter. The medicines may be:  Taken by mouth (orally).  Applied as a cream or powder to your skin. Follow these instructions at home:   Take or apply over-the-counter and prescription medicines only as told by your health care provider.  Maintain a healthy weight. If you need help losing weight, talk with your health care provider.  Keep your skin clean and dry.  If you have diabetes, keep your blood sugar under control.  Keep all follow-up visits as told by your health care provider. This is important. Contact a health care provider if:  Your symptoms go away and then return.  Your symptoms do not get better with treatment.  Your symptoms get worse.  Your rash spreads.  You have a fever or  chills.  You have new symptoms.  You have new warmth or redness of your skin. Summary  A skin yeast infection is a condition in which there is an overgrowth of yeast (candida) that normally lives on the skin. This condition is caused by a change in the normal balance of the yeast and bacteria that live on the skin.  Take or apply over-the-counter and prescription medicines only as told by your health care provider.  Keep  your skin clean and dry.  Contact a health care provider if your symptoms do not get better with treatment. This information is not intended to replace advice given to you by your health care provider. Make sure you discuss any questions you have with your health care provider. Document Revised: 06/27/2017 Document Reviewed: 06/27/2017 Elsevier Patient Education  2020 [employer].

## 2019-08-22 NOTE — Progress Notes (Signed)
  GYNECOLOGY PROGRESS NOTE  History:  [name] is a 19 y.o. G1P1001 presents to [facility] office today for problem gyn visit. She reports since she was treated for BV "a few weeks ago" she has had brownish-cream vaginal discharge, odor and vaginal dryness. She reports the "discharge is so much, I have to wear pantiliners all the time. Now I think I have been irritated or have a yeast infection."  Last SI was 08/17/2019. She denies h/a, dizziness, shortness of breath, n/v, or fever/chills.    The following portions of the patient's history were reviewed and updated as appropriate: allergies, current medications, past family history, past medical history, past social history, past surgical history and problem list. Last pap smear n/a due to age.  Review of Systems:  Pertinent items are noted in HPI.   Objective:  Physical Exam Blood pressure 115/71, pulse 92, temperature 98.1 F (36.7 C), temperature source Oral, height 5\' 2"  (1.575 m), weight 139 lb 3.2 oz (63.1 kg), not currently breastfeeding. VS reviewed, nursing note reviewed,  Constitutional: well developed, well nourished, no distress HEENT: normocephalic CV: normal rate Pulm/chest wall: normal effort Breast Exam: deferred Abdomen: soft Neuro: alert and oriented x 3 Skin: warm, dry Psych: affect normal Pelvic exam: Cervix not visualized d/t patient not tolerant of the speculum, moderate amount of thick, malodorous white creamy discharge, vaginal walls and external genitalia normal Bimanual exam: Cervix 0/long/high, firm, anterior, neg CMT, uterus nontender, nonenlarged, adnexa without tenderness, enlargement, or mass  Assessment & Plan:  1. Vaginal discharge - Cervicovaginal ancillary only( [place]) - Given alternative vaginitis therapies to try at home - Will treat according to results  2. Cutaneous candidiasis - Information provided on skin yeast infection  - Advised to d/c extended use of pantiliners  because of the moist environment on skin. Limit use to menstrual use only. - Rx for nystatin cream (MYCOSTATIN); Apply 1 application topically 2 (two) times daily. Apply EXTERNALLY only  Dispense: 30 g; Refill: 1    , CNM 9:31 AM

## 2019-08-23 LAB — CERVICOVAGINAL ANCILLARY ONLY
Bacterial Vaginitis (gardnerella): POSITIVE — AB
Candida Glabrata: NEGATIVE
Candida Vaginitis: NEGATIVE
Chlamydia: NEGATIVE
Comment: NEGATIVE
Comment: NEGATIVE
Comment: NEGATIVE
Comment: NEGATIVE
Comment: NEGATIVE
Comment: NORMAL
Neisseria Gonorrhea: NEGATIVE
Trichomonas: NEGATIVE

## 2019-08-27 ENCOUNTER — Telehealth: Payer: Self-pay | Admitting: *Deleted

## 2019-08-27 DIAGNOSIS — B9689 Other specified bacterial agents as the cause of diseases classified elsewhere: Secondary | ICD-10-CM

## 2019-08-27 DIAGNOSIS — N760 Acute vaginitis: Secondary | ICD-10-CM

## 2019-08-27 MED ORDER — METRONIDAZOLE 500 MG PO TABS: 500.0000 mg | ORAL_TABLET | Freq: Two times a day (BID) | ORAL | 0 refills | Status: DC

## 2019-08-27 NOTE — Telephone Encounter (Signed)
-----   Message from [name], [place] sent at 08/27/2019  5:56 AM EDT ----- Please test for BV and provide alternative vaginal therapies. Thanks!

## 2019-09-12 ENCOUNTER — Other Ambulatory Visit: Payer: Self-pay | Admitting: *Deleted

## 2019-09-12 MED ORDER — FLUCONAZOLE 150 MG PO TABS: 150.0000 mg | ORAL_TABLET | ORAL | 1 refills | Status: DC

## 2019-09-16 ENCOUNTER — Encounter: Payer: Self-pay | Admitting: General Practice

## 2019-09-22 DIAGNOSIS — Z419 Encounter for procedure for purposes other than remedying health state, unspecified: Secondary | ICD-10-CM | POA: Diagnosis not present

## 2019-10-10 ENCOUNTER — Encounter: Payer: Self-pay | Admitting: General Practice

## 2019-10-10 ENCOUNTER — Encounter: Payer: Self-pay | Admitting: Obstetrics and Gynecology

## 2019-10-10 ENCOUNTER — Telehealth: Payer: Self-pay | Admitting: Obstetrics and Gynecology

## 2019-10-10 ENCOUNTER — Other Ambulatory Visit (HOSPITAL_COMMUNITY)
Admission: RE | Admit: 2019-10-10 | Discharge: 2019-10-10 | Disposition: A | Discharge status: Home / Self Care | Payer: Medicaid Other | Source: Ambulatory Visit | Attending: Obstetrics and Gynecology | Admitting: Obstetrics and Gynecology

## 2019-10-10 ENCOUNTER — Other Ambulatory Visit: Payer: Self-pay

## 2019-10-10 ENCOUNTER — Ambulatory Visit (INDEPENDENT_AMBULATORY_CARE_PROVIDER_SITE_OTHER): Payer: Medicaid Other | Admitting: Obstetrics and Gynecology

## 2019-10-10 VITALS — BP 112/72 | HR 75 | Temp 98.9°F | Ht 62.0 in | Wt 144.0 lb

## 2019-10-10 DIAGNOSIS — N898 Other specified noninflammatory disorders of vagina: Secondary | ICD-10-CM | POA: Diagnosis not present

## 2019-10-10 DIAGNOSIS — Z202 Contact with and (suspected) exposure to infections with a predominantly sexual mode of transmission: Secondary | ICD-10-CM | POA: Diagnosis not present

## 2019-10-10 MED ORDER — VALACYCLOVIR HCL 1 G PO TABS: 1000.0000 mg | ORAL_TABLET | Freq: Two times a day (BID) | ORAL | 0 refills | Status: DC

## 2019-10-10 NOTE — Patient Instructions (Signed)
Genital Herpes Genital herpes is a common sexually transmitted infection (STI) that is caused by a virus. The virus spreads from person to person through sexual contact. Infection can cause itching, blisters, and sores around the genitals or rectum. Symptoms may last several days and then go away This is called an outbreak. However, the virus remains in your body, so you may have more outbreaks in the future. The time between outbreaks varies and can be months or years. Genital herpes affects men and women. It is particularly concerning for pregnant women because the virus can be passed to the baby during delivery and can cause serious problems. Genital herpes is also a concern for people who have a weak disease-fighting (immune) system. What are the causes? This condition is caused by the herpes simplex virus (HSV) type 1 or type 2. The virus may spread through:  Sexual contact with an infected person, including vaginal, anal, and oral sex.  Contact with fluid from a herpes sore.  The skin. This means that you can get herpes from an infected partner even if he or she does not have a visible sore or does not know that he or she is infected. What increases the risk? You are more likely to develop this condition if:  You have sex with many partners.  You do not use latex condoms during sex. What are the signs or symptoms? Most people do not have symptoms (asymptomatic) or have mild symptoms that may be mistaken for other skin problems. Symptoms may include:  Small red bumps near the genitals, rectum, or mouth. These bumps turn into blisters and then turn into sores.  Flu-like symptoms, including: ? Fever. ? Body aches. ? Swollen lymph nodes. ? Headache.  Painful urination.  Pain and itching in the genital area or rectal area.  Vaginal discharge.  Tingling or shooting pain in the legs and buttocks. Generally, symptoms are more severe and last longer during the first (primary)  outbreak. Flu-like symptoms are also more common during the primary outbreak. How is this diagnosed? Genital herpes may be diagnosed based on:  A physical exam.  Your medical history.  Blood tests.  A test of a fluid sample (culture) from an open sore. How is this treated? There is no cure for this condition, but treatment with antiviral medicines that are taken by mouth (orally) can do the following:  Speed up healing and relieve symptoms.  Help to reduce the spread of the virus to sexual partners.  Limit the chance of future outbreaks, or make future outbreaks shorter.  Lessen symptoms of future outbreaks. Your health care provider may also recommend pain relief medicines, such as aspirin or ibuprofen. Follow these instructions at home: Sexual activity  Do not have sexual contact during active outbreaks.  Practice safe sex. Latex condoms and female condoms may help prevent the spread of the herpes virus. General instructions  Keep the affected areas dry and clean.  Take over-the-counter and prescription medicines only as told by your health care provider.  Avoid rubbing or touching blisters and sores. If you do touch blisters or sores: ? Wash your hands thoroughly with soap and water. ? Do not touch your eyes afterward.  To help relieve pain or itching, you may take the following actions as directed by your health care provider: ? Apply a cold, wet cloth (cold compress) to affected areas 4-6 times a day. ? Apply a substance that protects your skin and reduces bleeding (astringent). ? Apply a gel that   helps relieve pain around sores (lidocaine gel). ? Take a warm, shallow bath that cleans the genital area (sitz bath).  Keep all follow-up visits as told by your health care provider. This is important. How is this prevented?  Use condoms. Although anyone can get genital herpes during sexual contact, even with the use of a condom, a condom can provide some  protection.  Avoid having multiple sexual partners.  Talk with your sexual partner about any symptoms either of you may have. Also, talk with your partner about any history of STIs.  Get tested for STIs before you have sex. Ask your partner to do the same.  Do not have sexual contact if you have symptoms of genital herpes. Contact a health care provider if:  Your symptoms are not improving with medicine.  Your symptoms return.  You have new symptoms.  You have a fever.  You have abdominal pain.  You have redness, swelling, or pain in your eye.  You notice new sores on other parts of your body.  You are a woman and experience bleeding between menstrual periods.  You have had herpes and you become pregnant or plan to become pregnant. Summary  Genital herpes is a common sexually transmitted infection (STI) that is caused by the herpes simplex virus (HSV) type 1 or type 2.  These viruses are most often spread through sexual contact with an infected person.  You are more likely to develop this condition if you have sex with many partners or you have unprotected sex.  Most people do not have symptoms (asymptomatic) or have mild symptoms that may be mistaken for other skin problems. Symptoms occur as outbreaks that may happen months or years apart.  There is no cure for this condition, but treatment with oral antiviral medicines can reduce symptoms, reduce the chance of spreading the virus to a partner, prevent future outbreaks, or shorten future outbreaks. This information is not intended to replace advice given to you by your health care provider. Make sure you discuss any questions you have with your health care provider. Document Revised: 08/14/2017 Document Reviewed: 01/08/2016 Elsevier Patient Education  2020 Elsevier Inc. Herpes Simplex Test Why am I having this test? The herpes simplex test is used to check for an infection with the herpes simplex virus (HSV). There are  two common types of HSV:  Type 1 (HSV1) often causes cold sores on or around the mouth and sometimes on or around the eyes.  Type 2 (HSV2) is a sexually transmitted infection that causes sores in and around the genitals. You may need to have an HSV test if:  Your health care provider thinks that you may have an HSV infection.  You have a weakened body defense system (immune system) and you have sores around your mouth or genitals that look like HSV eruptions.  You have sex with multiple partners, or your partner has genital herpes.  You are pregnant, have herpes, and are expecting to deliver a baby vaginally in the next 6-8 weeks. What is being tested? There are two types of herpes simplex tests:  Blood test. This test checks the sample for: ? HSV antibodies. Antibodies are proteins that your body makes to help fight infection. This test checks whether antibodies against HSV are in your blood. ? HSV antigens. This checks for the presence of the HSV virus (antigen) in your blood.  Culture test. This test checks for the virus in a sample of fluid from an open sore. Culture  tests take several days to complete but are very accurate. What kind of sample is taken?     Samples will be collected according to the type of tests your health care provider orders.  For the blood tests, a blood sample is usually collected by inserting a needle into a blood vessel.  For a culture test, the sample is usually collected by swabbing the fluid that is coming from an open sore during an active infection (outbreak). How are the results reported? Your test results will be reported as either positive or negative. For this test, normal results are:  Negative for HSV virus or antibodies in your blood.  Negative for HSV virus in cultured fluid. Sometimes, the test results may report that a condition is present when it is not present (false-positive result). Sometimes, the test results may report that a  condition is not present when it is present (false-negative result). What do the results mean?  A positive result may indicate that you have an active HSV infection. The presence of HSV1 or HSV2 antigens or antibodies in your blood may indicate an active HSV infection.  A negative result means that you do not have HSV1 or HSV2 virus in your blood. This may mean that you do not have HSV infection. Talk with your health care provider about what your results mean. Questions to ask your health care provider Ask your health care provider, or the department that is doing the test:  When will my results be ready?  How will I get my results?  What are my treatment options?  What other tests do I need?  What are my next steps? Summary  You may have this test if your health care provider suspects that you have a herpes simplex virus (HSV) infection.  Type 1 (HSV1) often causes cold sores on or around the mouth and sometimes on or around the eyes. Type 2 (HSV2) is a sexually transmitted infection that causes sores in and around the genitals.  The test may be done using a blood sample or a sample of fluid from an open sore.  A positive result may mean that you have an active HSV infection. A negative result means that you probably do not have an active infection. Talk with your health care provider about what your results mean. This information is not intended to replace advice given to you by your health care provider. Make sure you discuss any questions you have with your health care provider. Document Revised: 01/20/2017 Document Reviewed: 12/27/2016 Elsevier Patient Education  2020 [employer].

## 2019-10-10 NOTE — Telephone Encounter (Signed)
Patient calling requesting to have picture that was put in her chart sent to her via My Chart. Message sent and patient received.  [name], CNM

## 2019-10-10 NOTE — Progress Notes (Deleted)
  GYNECOLOGY PROGRESS NOTE  History:  Ms. Demesha Boorman is a 19 y.o. G1P1001 presents to [facility] office today for problem gyn visit. She reports ***.  She denies h/a, dizziness, shortness of breath, n/v, or fever/chills.    The following portions of the patient's history were reviewed and updated as appropriate: allergies, current medications, past family history, past medical history, past social history, past surgical history and problem list. Last pap smear on *** was normal, *** HRHPV.  Review of Systems:  Pertinent items are noted in HPI.   Objective:  Physical Exam Blood pressure 112/72, pulse 75, temperature 98.9 F (37.2 C), temperature source Oral, height 5\' 2"  (1.575 m), weight 144 lb (65.3 kg), not currently breastfeeding. VS reviewed, nursing note reviewed,  Constitutional: well developed, well nourished, no distress HEENT: normocephalic CV: normal rate Pulm/chest wall: normal effort Breast Exam: deferred Abdomen: soft Neuro: alert and oriented x 3 Skin: warm, dry Psych: affect normal Pelvic exam: Cervix pink, visually closed, without lesion, scant white creamy discharge, vaginal walls and external genitalia normal Bimanual exam: Cervix 0/long/high, firm, anterior, neg CMT, uterus nontender, nonenlarged, adnexa without tenderness, enlargement, or mass      Assessment & Plan:  1. Vaginal lesion ***  2. Vaginal irritation ***   , CNM 11:55 AM

## 2019-10-10 NOTE — Progress Notes (Signed)
  GYNECOLOGY PROGRESS NOTE  History:  [name] is a 19 y.o. G1P1001 presents to [facility] office today for problem gyn visit. She reports vaginal itching, painful sex, rash in between her butt cheeks; all sx's noticed this week. She also reports low back pain since delivery that makes it difficult for her to move around at times.  She denies h/a, dizziness, shortness of breath, n/v, or fever/chills.    The following portions of the patient's history were reviewed and updated as appropriate: allergies, current medications, past family history, past medical history, past social history, past surgical history and problem list.   Review of Systems:  Pertinent items are noted in HPI.   Objective:  Physical Exam Blood pressure 112/72, pulse 75, temperature 98.9 F (37.2 C), temperature source Oral, height 5\' 2"  (1.575 m), weight 144 lb (65.3 kg), not currently breastfeeding. VS reviewed, nursing note reviewed,  Constitutional: well developed, well nourished, no distress HEENT: normocephalic CV: normal rate Pulm/chest wall: normal effort Breast Exam: deferred Abdomen: soft Neuro: alert and oriented x 3 Skin: warm, dry Psych: affect normal Pelvic exam: vaginal introitus and RT buttock (**See pics below) found to have what appears to be ulcerated lesions; samples collected from each area and sent for testing       Assessment & Plan:  1. Vaginal lesion - HSV culture pending - Rx for Valtrex 1000 mg BID  - Advised no official dx of HSV, but very suspicious for HSV - Information provided on HSV and HV testing - Advised to abstain from sex until the lesions have cleared up.  - Advised to notify any current or future sex partners  2. Vaginal irritation - Will treat according to wet prep results   Time spent counseling patient on differential diagnoses and treatment plan was 20 minutes.  , CNM 11:55 AM

## 2019-10-11 LAB — CERVICOVAGINAL ANCILLARY ONLY
Bacterial Vaginitis (gardnerella): NEGATIVE
Candida Glabrata: NEGATIVE
Candida Vaginitis: POSITIVE — AB
Chlamydia: NEGATIVE
Comment: NEGATIVE
Comment: NEGATIVE
Comment: NEGATIVE
Comment: NEGATIVE
Comment: NEGATIVE
Comment: NORMAL
Neisseria Gonorrhea: NEGATIVE
Trichomonas: NEGATIVE

## 2019-10-13 LAB — HERPES SIMPLEX VIRUS CULTURE

## 2019-10-14 ENCOUNTER — Ambulatory Visit: Payer: Medicaid Other

## 2019-10-14 ENCOUNTER — Other Ambulatory Visit: Payer: Self-pay | Admitting: *Deleted

## 2019-10-14 DIAGNOSIS — B379 Candidiasis, unspecified: Secondary | ICD-10-CM

## 2019-10-14 MED ORDER — FLUCONAZOLE 150 MG PO TABS: 150.0000 mg | ORAL_TABLET | ORAL | 0 refills | Status: DC

## 2019-10-16 ENCOUNTER — Encounter: Payer: Self-pay | Admitting: Obstetrics and Gynecology

## 2019-10-23 DIAGNOSIS — Z419 Encounter for procedure for purposes other than remedying health state, unspecified: Secondary | ICD-10-CM | POA: Diagnosis not present

## 2019-11-22 DIAGNOSIS — Z419 Encounter for procedure for purposes other than remedying health state, unspecified: Secondary | ICD-10-CM | POA: Diagnosis not present

## 2019-12-11 DIAGNOSIS — B009 Herpesviral infection, unspecified: Secondary | ICD-10-CM | POA: Diagnosis not present

## 2019-12-11 DIAGNOSIS — Z131 Encounter for screening for diabetes mellitus: Secondary | ICD-10-CM | POA: Diagnosis not present

## 2019-12-11 DIAGNOSIS — F53 Postpartum depression: Secondary | ICD-10-CM | POA: Diagnosis not present

## 2019-12-23 DIAGNOSIS — Z419 Encounter for procedure for purposes other than remedying health state, unspecified: Secondary | ICD-10-CM | POA: Diagnosis not present

## 2020-01-02 DIAGNOSIS — Z0110 Encounter for examination of ears and hearing without abnormal findings: Secondary | ICD-10-CM | POA: Diagnosis not present

## 2020-01-02 DIAGNOSIS — Z1329 Encounter for screening for other suspected endocrine disorder: Secondary | ICD-10-CM | POA: Diagnosis not present

## 2020-01-02 DIAGNOSIS — F53 Postpartum depression: Secondary | ICD-10-CM | POA: Diagnosis not present

## 2020-01-02 DIAGNOSIS — B009 Herpesviral infection, unspecified: Secondary | ICD-10-CM | POA: Diagnosis not present

## 2020-01-02 DIAGNOSIS — Z131 Encounter for screening for diabetes mellitus: Secondary | ICD-10-CM | POA: Diagnosis not present

## 2020-01-02 DIAGNOSIS — Z0000 Encounter for general adult medical examination without abnormal findings: Secondary | ICD-10-CM | POA: Diagnosis not present

## 2020-01-02 DIAGNOSIS — Z13220 Encounter for screening for lipoid disorders: Secondary | ICD-10-CM | POA: Diagnosis not present

## 2020-01-22 DIAGNOSIS — Z419 Encounter for procedure for purposes other than remedying health state, unspecified: Secondary | ICD-10-CM | POA: Diagnosis not present

## 2020-02-22 DIAGNOSIS — Z419 Encounter for procedure for purposes other than remedying health state, unspecified: Secondary | ICD-10-CM | POA: Diagnosis not present

## 2020-03-24 DIAGNOSIS — Z419 Encounter for procedure for purposes other than remedying health state, unspecified: Secondary | ICD-10-CM | POA: Diagnosis not present

## 2020-04-02 ENCOUNTER — Ambulatory Visit: Payer: Medicaid Other | Admitting: Obstetrics and Gynecology

## 2020-04-03 DIAGNOSIS — B373 Candidiasis of vulva and vagina: Secondary | ICD-10-CM | POA: Diagnosis not present

## 2020-04-03 DIAGNOSIS — B009 Herpesviral infection, unspecified: Secondary | ICD-10-CM | POA: Diagnosis not present

## 2020-04-03 DIAGNOSIS — F53 Postpartum depression: Secondary | ICD-10-CM | POA: Diagnosis not present

## 2020-04-21 DIAGNOSIS — Z419 Encounter for procedure for purposes other than remedying health state, unspecified: Secondary | ICD-10-CM | POA: Diagnosis not present

## 2020-05-21 ENCOUNTER — Encounter: Payer: Self-pay | Admitting: Obstetrics and Gynecology

## 2020-05-21 ENCOUNTER — Other Ambulatory Visit (HOSPITAL_COMMUNITY)
Admission: RE | Admit: 2020-05-21 | Discharge: 2020-05-21 | Disposition: A | Discharge status: Home / Self Care | Payer: Medicaid Other | Source: Ambulatory Visit | Attending: Obstetrics and Gynecology | Admitting: Obstetrics and Gynecology

## 2020-05-21 ENCOUNTER — Ambulatory Visit (INDEPENDENT_AMBULATORY_CARE_PROVIDER_SITE_OTHER): Payer: Medicaid Other | Admitting: Obstetrics and Gynecology

## 2020-05-21 ENCOUNTER — Other Ambulatory Visit: Payer: Self-pay

## 2020-05-21 VITALS — BP 112/72 | HR 83 | Temp 98.0°F | Ht 62.0 in | Wt 135.0 lb

## 2020-05-21 DIAGNOSIS — Z3201 Encounter for pregnancy test, result positive: Secondary | ICD-10-CM

## 2020-05-21 DIAGNOSIS — Z01419 Encounter for gynecological examination (general) (routine) without abnormal findings: Secondary | ICD-10-CM | POA: Insufficient documentation

## 2020-05-21 DIAGNOSIS — Z113 Encounter for screening for infections with a predominantly sexual mode of transmission: Secondary | ICD-10-CM

## 2020-05-21 DIAGNOSIS — Z3042 Encounter for surveillance of injectable contraceptive: Secondary | ICD-10-CM | POA: Diagnosis not present

## 2020-05-21 DIAGNOSIS — O039 Complete or unspecified spontaneous abortion without complication: Secondary | ICD-10-CM

## 2020-05-21 DIAGNOSIS — Z124 Encounter for screening for malignant neoplasm of cervix: Secondary | ICD-10-CM | POA: Diagnosis not present

## 2020-05-21 DIAGNOSIS — Z0000 Encounter for general adult medical examination without abnormal findings: Secondary | ICD-10-CM | POA: Diagnosis not present

## 2020-05-21 LAB — POCT URINE PREGNANCY: Preg Test, Ur: POSITIVE — AB

## 2020-05-21 NOTE — Progress Notes (Signed)
WELL-WOMAN PHYSICAL & PAP Patient name: Shannon Hogan MRN [id number]  Date of birth: 25-Mar-2000 Chief Complaint:   Gynecologic Exam and Contraception  History of Present Illness:   Shannon Hogan is a 20 y.o. G4P1001 African American female being seen today for a routine well-woman exam.  Current complaints: TAB used pill at [facility] ~ 3 weeks ago, spotting, last SI was the end of February. She desires to restart Depo injections.  PCP: none      does desire labs Patient's last menstrual period was 05/05/2020 (approximate). The current method of family planning is none.  Last pap n/a. Results were: n/a Last mammogram: n/a. Results were: n/a. Family h/o breast cancer: No Last colonoscopy: n/a. Results were: n/a. Family h/o colorectal cancer: No Review of Systems:   Pertinent items are noted in HPI Denies any headaches, blurred vision, fatigue, shortness of breath, chest pain, abdominal pain, abnormal vaginal discharge/itching/odor/irritation, problems with periods, bowel movements, urination, or intercourse unless otherwise stated above. Pertinent History Reviewed:  Reviewed past medical,surgical, social and family history.  Reviewed problem list, medications and allergies. Physical Assessment:   Vitals:   05/21/20 1016  BP: 112/72  Pulse: 83  Temp: 98 F (36.7 C)  TempSrc: Oral  Weight: 135 lb (61.2 kg)  Height: 5\' 2"  (1.575 m)  Body mass index is 24.69 kg/m.        Physical Examination:   General appearance - well appearing, and in no distress  Mental status - alert, oriented to person, place, and time  Psych:  She has a normal mood and affect  Skin - warm and dry, normal color, no suspicious lesions noted  Chest - effort normal, all lung fields clear to auscultation bilaterally  Heart - normal rate and regular rhythm  Neck:  midline trachea, no thyromegaly or nodules  Breasts - breasts appear normal, no suspicious masses, no skin or nipple changes or   axillary nodes  Abdomen - soft, nontender, nondistended, no masses or organomegaly  Pelvic - VULVA: normal appearing vulva with no masses, tenderness or lesions  VAGINA: normal appearing vagina with normal color and discharge, no lesions  CERVIX: normal appearing cervix without discharge or lesions, no CMT  Thin prep pap is done with reflex HR HPV cotesting  UTERUS: uterus is felt to be normal size, shape, consistency and nontender   ADNEXA: No adnexal masses or tenderness noted.  Rectal - deferred  Extremities:  No swelling or varicosities noted  Results for orders placed or performed in visit on 05/21/20 (from the past 24 hour(s))  POCT urine pregnancy   Collection Time: 05/21/20 10:55 AM  Result Value Ref Range   Preg Test, Ur Positive (A) Negative    Assessment & Plan:  1) Encounter for annual routine gynecological examination  - Cytology - PAP( [place]),  - Cervicovaginal ancillary only( [place]),  - POCT urine pregnancy,  - RPR+HBsAg+HCVAb+...  2) Screen for STD (sexually transmitted disease)  - Cervicovaginal ancillary only( [place]),  - RPR+HBsAg+HCVAb+...  3) Family planning, Depo-Provera contraception monitoring/administration  - POCT urine pregnancy - Hold Depo injection for today  4) Abortion  - (+) UPT - Beta hCG quant (ref lab). Dependent on HCG level, may need weekly HCG levels. - Depo after HCG less than 5    Labs/procedures today: STI labs, pap smear  Mammogram at age 89 or sooner if problems Colonoscopy at age 30 or sooner if problems  Orders Placed This Encounter  Procedures  .  RPR+HBsAg+HCVAb+...  . Beta hCG quant (ref lab)  . POCT urine pregnancy    Meds: No orders of the defined types were placed in this encounter.   Follow-up: No follow-ups on file.  [name] MSN, CNM 05/21/2020 12:41 PM

## 2020-05-22 DIAGNOSIS — Z419 Encounter for procedure for purposes other than remedying health state, unspecified: Secondary | ICD-10-CM | POA: Diagnosis not present

## 2020-05-22 LAB — CERVICOVAGINAL ANCILLARY ONLY
Bacterial Vaginitis (gardnerella): NEGATIVE
Candida Glabrata: NEGATIVE
Candida Vaginitis: NEGATIVE
Chlamydia: NEGATIVE
Comment: NEGATIVE
Comment: NEGATIVE
Comment: NEGATIVE
Comment: NEGATIVE
Comment: NEGATIVE
Comment: NORMAL
Neisseria Gonorrhea: NEGATIVE
Trichomonas: NEGATIVE

## 2020-05-22 LAB — RPR+HBSAG+HCVAB+...
HIV Screen 4th Generation wRfx: NONREACTIVE
Hep C Virus Ab: <0.1 s/co ratio (ref 0.0–0.9)
Hepatitis B Surface Ag: NEGATIVE
RPR Ser Ql: NONREACTIVE

## 2020-05-22 LAB — BETA HCG QUANT (REF LAB): hCG Quant: 149 m[IU]/mL

## 2020-05-25 LAB — CYTOLOGY - PAP: Diagnosis: UNDETERMINED — AB

## 2020-05-26 ENCOUNTER — Telehealth: Payer: Self-pay | Admitting: *Deleted

## 2020-05-26 ENCOUNTER — Encounter: Payer: Self-pay | Admitting: *Deleted

## 2020-05-26 NOTE — Telephone Encounter (Signed)
-----   Message from [name], [place] sent at 05/26/2020 10:06 AM EDT ----- Repeat pap in 1 year

## 2020-06-02 ENCOUNTER — Other Ambulatory Visit: Payer: Medicaid Other

## 2020-06-03 ENCOUNTER — Other Ambulatory Visit (INDEPENDENT_AMBULATORY_CARE_PROVIDER_SITE_OTHER): Payer: Medicaid Other | Admitting: *Deleted

## 2020-06-03 ENCOUNTER — Other Ambulatory Visit: Payer: Self-pay

## 2020-06-03 DIAGNOSIS — O039 Complete or unspecified spontaneous abortion without complication: Secondary | ICD-10-CM

## 2020-06-03 NOTE — Progress Notes (Signed)
   Patient in clinic for beta Hcg. Patient used TAB pill last month.  Clovis Pu, RN

## 2020-06-04 LAB — BETA HCG QUANT (REF LAB): hCG Quant: 23 m[IU]/mL

## 2020-06-12 ENCOUNTER — Other Ambulatory Visit: Payer: Self-pay

## 2020-06-12 ENCOUNTER — Other Ambulatory Visit (INDEPENDENT_AMBULATORY_CARE_PROVIDER_SITE_OTHER): Payer: Medicaid Other | Admitting: *Deleted

## 2020-06-12 DIAGNOSIS — O039 Complete or unspecified spontaneous abortion without complication: Secondary | ICD-10-CM | POA: Diagnosis not present

## 2020-06-12 NOTE — Progress Notes (Signed)
   Patient in clinic for repeat beta hcg.   Clovis Pu, RN

## 2020-06-13 LAB — BETA HCG QUANT (REF LAB): hCG Quant: 9 m[IU]/mL

## 2020-06-21 DIAGNOSIS — Z419 Encounter for procedure for purposes other than remedying health state, unspecified: Secondary | ICD-10-CM | POA: Diagnosis not present

## 2020-07-22 DIAGNOSIS — Z419 Encounter for procedure for purposes other than remedying health state, unspecified: Secondary | ICD-10-CM | POA: Diagnosis not present

## 2020-08-21 DIAGNOSIS — Z419 Encounter for procedure for purposes other than remedying health state, unspecified: Secondary | ICD-10-CM | POA: Diagnosis not present

## 2020-08-24 ENCOUNTER — Inpatient Hospital Stay (HOSPITAL_COMMUNITY)
Admission: EM | Admit: 2020-08-24 | Discharge: 2020-09-03 | DRG: 957 | Disposition: A | Discharge status: Home Health | Payer: Medicaid Other | Attending: General Surgery | Admitting: General Surgery

## 2020-08-24 ENCOUNTER — Emergency Department (HOSPITAL_COMMUNITY): Payer: Medicaid Other

## 2020-08-24 DIAGNOSIS — R0603 Acute respiratory distress: Secondary | ICD-10-CM | POA: Diagnosis not present

## 2020-08-24 DIAGNOSIS — Z20822 Contact with and (suspected) exposure to covid-19: Secondary | ICD-10-CM | POA: Diagnosis present

## 2020-08-24 DIAGNOSIS — R578 Other shock: Secondary | ICD-10-CM | POA: Diagnosis present

## 2020-08-24 DIAGNOSIS — S37061A Major laceration of right kidney, initial encounter: Secondary | ICD-10-CM | POA: Diagnosis present

## 2020-08-24 DIAGNOSIS — E876 Hypokalemia: Secondary | ICD-10-CM | POA: Diagnosis not present

## 2020-08-24 DIAGNOSIS — S3722XA Contusion of bladder, initial encounter: Secondary | ICD-10-CM | POA: Diagnosis not present

## 2020-08-24 DIAGNOSIS — S37819A Unspecified injury of adrenal gland, initial encounter: Secondary | ICD-10-CM | POA: Diagnosis present

## 2020-08-24 DIAGNOSIS — S3282XA Multiple fractures of pelvis without disruption of pelvic ring, initial encounter for closed fracture: Secondary | ICD-10-CM | POA: Diagnosis not present

## 2020-08-24 DIAGNOSIS — S36113A Laceration of liver, unspecified degree, initial encounter: Secondary | ICD-10-CM | POA: Diagnosis not present

## 2020-08-24 DIAGNOSIS — T07XXXA Unspecified multiple injuries, initial encounter: Secondary | ICD-10-CM | POA: Diagnosis not present

## 2020-08-24 DIAGNOSIS — J90 Pleural effusion, not elsewhere classified: Secondary | ICD-10-CM | POA: Diagnosis not present

## 2020-08-24 DIAGNOSIS — Z4659 Encounter for fitting and adjustment of other gastrointestinal appliance and device: Secondary | ICD-10-CM

## 2020-08-24 DIAGNOSIS — S270XXA Traumatic pneumothorax, initial encounter: Principal | ICD-10-CM | POA: Diagnosis present

## 2020-08-24 DIAGNOSIS — E861 Hypovolemia: Secondary | ICD-10-CM

## 2020-08-24 DIAGNOSIS — I959 Hypotension, unspecified: Secondary | ICD-10-CM | POA: Diagnosis not present

## 2020-08-24 DIAGNOSIS — R456 Violent behavior: Secondary | ICD-10-CM | POA: Diagnosis not present

## 2020-08-24 DIAGNOSIS — S199XXA Unspecified injury of neck, initial encounter: Secondary | ICD-10-CM | POA: Diagnosis not present

## 2020-08-24 DIAGNOSIS — S299XXA Unspecified injury of thorax, initial encounter: Secondary | ICD-10-CM | POA: Diagnosis not present

## 2020-08-24 DIAGNOSIS — Z419 Encounter for procedure for purposes other than remedying health state, unspecified: Secondary | ICD-10-CM

## 2020-08-24 DIAGNOSIS — Z4682 Encounter for fitting and adjustment of non-vascular catheter: Secondary | ICD-10-CM | POA: Diagnosis not present

## 2020-08-24 DIAGNOSIS — S0993XA Unspecified injury of face, initial encounter: Secondary | ICD-10-CM | POA: Diagnosis not present

## 2020-08-24 DIAGNOSIS — R310 Gross hematuria: Secondary | ICD-10-CM | POA: Diagnosis present

## 2020-08-24 DIAGNOSIS — S37091A Other injury of right kidney, initial encounter: Secondary | ICD-10-CM

## 2020-08-24 DIAGNOSIS — J439 Emphysema, unspecified: Secondary | ICD-10-CM | POA: Diagnosis not present

## 2020-08-24 DIAGNOSIS — S3216XA Type 3 fracture of sacrum, initial encounter for closed fracture: Secondary | ICD-10-CM | POA: Diagnosis present

## 2020-08-24 DIAGNOSIS — S329XXA Fracture of unspecified parts of lumbosacral spine and pelvis, initial encounter for closed fracture: Secondary | ICD-10-CM

## 2020-08-24 DIAGNOSIS — S0990XA Unspecified injury of head, initial encounter: Secondary | ICD-10-CM | POA: Diagnosis not present

## 2020-08-24 DIAGNOSIS — M542 Cervicalgia: Secondary | ICD-10-CM

## 2020-08-24 DIAGNOSIS — S32511A Fracture of superior rim of right pubis, initial encounter for closed fracture: Secondary | ICD-10-CM | POA: Diagnosis present

## 2020-08-24 DIAGNOSIS — S36119A Unspecified injury of liver, initial encounter: Secondary | ICD-10-CM | POA: Diagnosis present

## 2020-08-24 DIAGNOSIS — J9690 Respiratory failure, unspecified, unspecified whether with hypoxia or hypercapnia: Secondary | ICD-10-CM

## 2020-08-24 DIAGNOSIS — R410 Disorientation, unspecified: Secondary | ICD-10-CM | POA: Diagnosis not present

## 2020-08-24 DIAGNOSIS — S2243XA Multiple fractures of ribs, bilateral, initial encounter for closed fracture: Secondary | ICD-10-CM | POA: Diagnosis present

## 2020-08-24 DIAGNOSIS — D62 Acute posthemorrhagic anemia: Secondary | ICD-10-CM | POA: Diagnosis present

## 2020-08-24 DIAGNOSIS — S37031A Laceration of right kidney, unspecified degree, initial encounter: Secondary | ICD-10-CM | POA: Diagnosis not present

## 2020-08-24 DIAGNOSIS — S27321A Contusion of lung, unilateral, initial encounter: Secondary | ICD-10-CM | POA: Diagnosis present

## 2020-08-24 DIAGNOSIS — S37812A Contusion of adrenal gland, initial encounter: Secondary | ICD-10-CM | POA: Diagnosis not present

## 2020-08-24 DIAGNOSIS — K439 Ventral hernia without obstruction or gangrene: Secondary | ICD-10-CM | POA: Diagnosis present

## 2020-08-24 DIAGNOSIS — R140 Abdominal distension (gaseous): Secondary | ICD-10-CM | POA: Diagnosis not present

## 2020-08-24 DIAGNOSIS — T1490XA Injury, unspecified, initial encounter: Secondary | ICD-10-CM

## 2020-08-24 DIAGNOSIS — S0181XA Laceration without foreign body of other part of head, initial encounter: Secondary | ICD-10-CM | POA: Diagnosis present

## 2020-08-24 DIAGNOSIS — S300XXA Contusion of lower back and pelvis, initial encounter: Secondary | ICD-10-CM | POA: Diagnosis present

## 2020-08-24 DIAGNOSIS — R404 Transient alteration of awareness: Secondary | ICD-10-CM | POA: Diagnosis not present

## 2020-08-24 DIAGNOSIS — J9601 Acute respiratory failure with hypoxia: Secondary | ICD-10-CM | POA: Diagnosis present

## 2020-08-24 DIAGNOSIS — S37001A Unspecified injury of right kidney, initial encounter: Secondary | ICD-10-CM | POA: Diagnosis not present

## 2020-08-24 LAB — COMPREHENSIVE METABOLIC PANEL
ALT: 939 U/L — ABNORMAL HIGH (ref 0–44)
AST: 912 U/L — ABNORMAL HIGH (ref 15–41)
Albumin: 3.4 g/dL — ABNORMAL LOW (ref 3.5–5.0)
Alkaline Phosphatase: 51 U/L (ref 38–126)
Anion gap: 17 — ABNORMAL HIGH (ref 5–15)
BUN: 17 mg/dL (ref 6–20)
CO2: 15 mmol/L — ABNORMAL LOW (ref 22–32)
Calcium: 9.2 mg/dL (ref 8.9–10.3)
Chloride: 108 mmol/L (ref 98–111)
Creatinine, Ser: 0.97 mg/dL (ref 0.44–1.00)
GFR, Estimated: >60 mL/min (ref >60)
Glucose, Bld: 196 mg/dL — ABNORMAL HIGH (ref 70–99)
Potassium: 3.6 mmol/L (ref 3.5–5.1)
Sodium: 140 mmol/L (ref 135–145)
Total Bilirubin: 0.6 mg/dL (ref 0.3–1.2)
Total Protein: 6.4 g/dL — ABNORMAL LOW (ref 6.5–8.1)

## 2020-08-24 LAB — RESP PANEL BY RT-PCR (FLU A&B, COVID) ARPGX2
Influenza A by PCR: NEGATIVE
Influenza B by PCR: NEGATIVE
SARS Coronavirus 2 by RT PCR: NEGATIVE

## 2020-08-24 LAB — CBC
HCT: 37.5 % (ref 36.0–46.0)
Hemoglobin: 11.1 g/dL — ABNORMAL LOW (ref 12.0–15.0)
MCH: 25.9 pg — ABNORMAL LOW (ref 26.0–34.0)
MCHC: 29.6 g/dL — ABNORMAL LOW (ref 30.0–36.0)
MCV: 87.4 fL (ref 80.0–100.0)
Platelets: 248 10*3/uL (ref 150–400)
RBC: 4.29 MIL/uL (ref 3.87–5.11)
RDW: 13.8 % (ref 11.5–15.5)
WBC: 6.1 10*3/uL (ref 4.0–10.5)
nRBC: 0.3 % — ABNORMAL HIGH (ref 0.0–0.2)

## 2020-08-24 LAB — I-STAT CHEM 8, ED
BUN: 21 mg/dL — ABNORMAL HIGH (ref 6–20)
Calcium, Ion: 1.15 mmol/L (ref 1.15–1.40)
Chloride: 108 mmol/L (ref 98–111)
Creatinine, Ser: 1 mg/dL (ref 0.44–1.00)
Glucose, Bld: 184 mg/dL — ABNORMAL HIGH (ref 70–99)
HCT: 37 % (ref 36.0–46.0)
Hemoglobin: 12.6 g/dL (ref 12.0–15.0)
Potassium: 3.5 mmol/L (ref 3.5–5.1)
Sodium: 141 mmol/L (ref 135–145)
TCO2: 17 mmol/L — ABNORMAL LOW (ref 22–32)

## 2020-08-24 LAB — ETHANOL: Alcohol, Ethyl (B): 18 mg/dL — ABNORMAL HIGH (ref <10)

## 2020-08-24 LAB — I-STAT BETA HCG BLOOD, ED (MC, WL, AP ONLY): I-stat hCG, quantitative: <5 m[IU]/mL (ref <5)

## 2020-08-24 LAB — LACTIC ACID, PLASMA: Lactic Acid, Venous: 9.6 mmol/L (ref 0.5–1.9)

## 2020-08-24 MED ORDER — MIDAZOLAM 50MG/50ML (1MG/ML) PREMIX INFUSION
0.5000 mg/h | INTRAVENOUS | Status: DC
  Administered 2020-08-24: 5 mg/h via INTRAVENOUS
  Filled 2020-08-24 (×2): qty 50

## 2020-08-24 MED ORDER — ETOMIDATE 2 MG/ML IV SOLN
INTRAVENOUS | Status: AC | PRN
  Administered 2020-08-24: 20 mg via INTRAVENOUS

## 2020-08-24 MED ORDER — PROPOFOL 10 MG/ML IV BOLUS
INTRAVENOUS | Status: AC | PRN
  Administered 2020-08-24: 1350 ug via INTRAVENOUS

## 2020-08-24 MED ORDER — IOHEXOL 300 MG/ML  SOLN
100.0000 mL | Freq: Once | INTRAMUSCULAR | Status: AC | PRN
  Administered 2020-08-24: 100 mL via INTRAVENOUS

## 2020-08-24 MED ORDER — SODIUM CHLORIDE 0.9 % IV SOLN
INTRAVENOUS | Status: AC | PRN
  Administered 2020-08-24: 1000 mL via INTRAVENOUS

## 2020-08-24 MED ORDER — FENTANYL 2500MCG IN NS 250ML (10MCG/ML) PREMIX INFUSION
0.0000 ug/h | INTRAVENOUS | Status: DC
  Administered 2020-08-24: 5 ug/h via INTRAVENOUS
  Filled 2020-08-24: qty 250

## 2020-08-24 MED ORDER — FENTANYL CITRATE (PF) 100 MCG/2ML IJ SOLN
INTRAMUSCULAR | Status: AC
  Filled 2020-08-24: qty 2

## 2020-08-24 MED ORDER — ROCURONIUM BROMIDE 50 MG/5ML IV SOLN
INTRAVENOUS | Status: AC | PRN
  Administered 2020-08-24: 100 mg via INTRAVENOUS

## 2020-08-24 NOTE — ED Notes (Signed)
Positive FAST exam RUQ and pelvis per Dr. Silverio Lay
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
## 2020-08-25 ENCOUNTER — Inpatient Hospital Stay (HOSPITAL_COMMUNITY): Payer: Medicaid Other | Admitting: Anesthesiology

## 2020-08-25 ENCOUNTER — Inpatient Hospital Stay: Payer: Self-pay

## 2020-08-25 ENCOUNTER — Encounter (HOSPITAL_COMMUNITY): Payer: Self-pay | Admitting: General Surgery

## 2020-08-25 ENCOUNTER — Inpatient Hospital Stay (HOSPITAL_COMMUNITY): Payer: Medicaid Other

## 2020-08-25 ENCOUNTER — Encounter (HOSPITAL_COMMUNITY): Admission: EM | Disposition: A | Discharge status: Home Health | Payer: Self-pay | Source: Home / Self Care

## 2020-08-25 DIAGNOSIS — S3282XA Multiple fractures of pelvis without disruption of pelvic ring, initial encounter for closed fracture: Secondary | ICD-10-CM | POA: Diagnosis not present

## 2020-08-25 DIAGNOSIS — R0603 Acute respiratory distress: Secondary | ICD-10-CM | POA: Diagnosis not present

## 2020-08-25 DIAGNOSIS — S3722XA Contusion of bladder, initial encounter: Secondary | ICD-10-CM | POA: Diagnosis not present

## 2020-08-25 DIAGNOSIS — S300XXA Contusion of lower back and pelvis, initial encounter: Secondary | ICD-10-CM | POA: Diagnosis present

## 2020-08-25 DIAGNOSIS — S2243XA Multiple fractures of ribs, bilateral, initial encounter for closed fracture: Secondary | ICD-10-CM | POA: Diagnosis not present

## 2020-08-25 DIAGNOSIS — S37819A Unspecified injury of adrenal gland, initial encounter: Secondary | ICD-10-CM | POA: Diagnosis not present

## 2020-08-25 DIAGNOSIS — D62 Acute posthemorrhagic anemia: Secondary | ICD-10-CM | POA: Diagnosis not present

## 2020-08-25 DIAGNOSIS — S3210XA Unspecified fracture of sacrum, initial encounter for closed fracture: Secondary | ICD-10-CM | POA: Diagnosis not present

## 2020-08-25 DIAGNOSIS — S069X9A Unspecified intracranial injury with loss of consciousness of unspecified duration, initial encounter: Secondary | ICD-10-CM | POA: Diagnosis not present

## 2020-08-25 DIAGNOSIS — S0993XA Unspecified injury of face, initial encounter: Secondary | ICD-10-CM | POA: Diagnosis not present

## 2020-08-25 DIAGNOSIS — Z9911 Dependence on respirator [ventilator] status: Secondary | ICD-10-CM | POA: Diagnosis not present

## 2020-08-25 DIAGNOSIS — S36119A Unspecified injury of liver, initial encounter: Secondary | ICD-10-CM | POA: Diagnosis not present

## 2020-08-25 DIAGNOSIS — I959 Hypotension, unspecified: Secondary | ICD-10-CM | POA: Diagnosis not present

## 2020-08-25 DIAGNOSIS — S32501A Unspecified fracture of right pubis, initial encounter for closed fracture: Secondary | ICD-10-CM | POA: Diagnosis not present

## 2020-08-25 DIAGNOSIS — S37061A Major laceration of right kidney, initial encounter: Secondary | ICD-10-CM | POA: Diagnosis not present

## 2020-08-25 DIAGNOSIS — S299XXA Unspecified injury of thorax, initial encounter: Secondary | ICD-10-CM | POA: Diagnosis not present

## 2020-08-25 DIAGNOSIS — S0181XA Laceration without foreign body of other part of head, initial encounter: Secondary | ICD-10-CM | POA: Diagnosis not present

## 2020-08-25 DIAGNOSIS — S32511A Fracture of superior rim of right pubis, initial encounter for closed fracture: Secondary | ICD-10-CM | POA: Diagnosis not present

## 2020-08-25 DIAGNOSIS — Z20822 Contact with and (suspected) exposure to covid-19: Secondary | ICD-10-CM | POA: Diagnosis not present

## 2020-08-25 DIAGNOSIS — S37031A Laceration of right kidney, unspecified degree, initial encounter: Secondary | ICD-10-CM | POA: Diagnosis not present

## 2020-08-25 DIAGNOSIS — Z4682 Encounter for fitting and adjustment of non-vascular catheter: Secondary | ICD-10-CM | POA: Diagnosis not present

## 2020-08-25 DIAGNOSIS — S062X9A Diffuse traumatic brain injury with loss of consciousness of unspecified duration, initial encounter: Secondary | ICD-10-CM | POA: Diagnosis not present

## 2020-08-25 DIAGNOSIS — I639 Cerebral infarction, unspecified: Secondary | ICD-10-CM | POA: Diagnosis not present

## 2020-08-25 DIAGNOSIS — J9690 Respiratory failure, unspecified, unspecified whether with hypoxia or hypercapnia: Secondary | ICD-10-CM | POA: Diagnosis not present

## 2020-08-25 DIAGNOSIS — S37812A Contusion of adrenal gland, initial encounter: Secondary | ICD-10-CM | POA: Diagnosis not present

## 2020-08-25 DIAGNOSIS — K439 Ventral hernia without obstruction or gangrene: Secondary | ICD-10-CM | POA: Diagnosis present

## 2020-08-25 DIAGNOSIS — S36113A Laceration of liver, unspecified degree, initial encounter: Secondary | ICD-10-CM | POA: Diagnosis not present

## 2020-08-25 DIAGNOSIS — J439 Emphysema, unspecified: Secondary | ICD-10-CM | POA: Diagnosis not present

## 2020-08-25 DIAGNOSIS — J9621 Acute and chronic respiratory failure with hypoxia: Secondary | ICD-10-CM | POA: Diagnosis not present

## 2020-08-25 DIAGNOSIS — J90 Pleural effusion, not elsewhere classified: Secondary | ICD-10-CM | POA: Diagnosis not present

## 2020-08-25 DIAGNOSIS — S36115A Moderate laceration of liver, initial encounter: Secondary | ICD-10-CM | POA: Diagnosis not present

## 2020-08-25 DIAGNOSIS — E861 Hypovolemia: Secondary | ICD-10-CM | POA: Diagnosis present

## 2020-08-25 DIAGNOSIS — K3189 Other diseases of stomach and duodenum: Secondary | ICD-10-CM | POA: Diagnosis not present

## 2020-08-25 DIAGNOSIS — S3216XA Type 3 fracture of sacrum, initial encounter for closed fracture: Secondary | ICD-10-CM | POA: Diagnosis not present

## 2020-08-25 DIAGNOSIS — S270XXA Traumatic pneumothorax, initial encounter: Secondary | ICD-10-CM | POA: Diagnosis not present

## 2020-08-25 DIAGNOSIS — J9601 Acute respiratory failure with hypoxia: Secondary | ICD-10-CM | POA: Diagnosis not present

## 2020-08-25 DIAGNOSIS — R140 Abdominal distension (gaseous): Secondary | ICD-10-CM | POA: Diagnosis not present

## 2020-08-25 DIAGNOSIS — S329XXA Fracture of unspecified parts of lumbosacral spine and pelvis, initial encounter for closed fracture: Secondary | ICD-10-CM | POA: Diagnosis not present

## 2020-08-25 DIAGNOSIS — S37001A Unspecified injury of right kidney, initial encounter: Secondary | ICD-10-CM | POA: Diagnosis not present

## 2020-08-25 DIAGNOSIS — Z041 Encounter for examination and observation following transport accident: Secondary | ICD-10-CM | POA: Diagnosis not present

## 2020-08-25 DIAGNOSIS — S199XXA Unspecified injury of neck, initial encounter: Secondary | ICD-10-CM | POA: Diagnosis not present

## 2020-08-25 DIAGNOSIS — S27321A Contusion of lung, unilateral, initial encounter: Secondary | ICD-10-CM | POA: Diagnosis not present

## 2020-08-25 DIAGNOSIS — T1490XA Injury, unspecified, initial encounter: Secondary | ICD-10-CM | POA: Diagnosis not present

## 2020-08-25 DIAGNOSIS — R310 Gross hematuria: Secondary | ICD-10-CM | POA: Diagnosis present

## 2020-08-25 DIAGNOSIS — S0990XA Unspecified injury of head, initial encounter: Secondary | ICD-10-CM | POA: Diagnosis not present

## 2020-08-25 DIAGNOSIS — R578 Other shock: Secondary | ICD-10-CM | POA: Diagnosis not present

## 2020-08-25 DIAGNOSIS — E876 Hypokalemia: Secondary | ICD-10-CM | POA: Diagnosis not present

## 2020-08-25 LAB — URINALYSIS, ROUTINE W REFLEX MICROSCOPIC

## 2020-08-25 LAB — I-STAT ARTERIAL BLOOD GAS, ED
Acid-base deficit: 8 mmol/L — ABNORMAL HIGH (ref 0.0–2.0)
Bicarbonate: 20.8 mmol/L (ref 20.0–28.0)
Calcium, Ion: 1.08 mmol/L — ABNORMAL LOW (ref 1.15–1.40)
HCT: 32 % — ABNORMAL LOW (ref 36.0–46.0)
Hemoglobin: 10.9 g/dL — ABNORMAL LOW (ref 12.0–15.0)
O2 Saturation: 100 %
Patient temperature: 97.8
Potassium: 3.3 mmol/L — ABNORMAL LOW (ref 3.5–5.1)
Sodium: 141 mmol/L (ref 135–145)
TCO2: 23 mmol/L (ref 22–32)
pCO2 arterial: 56 mmHg — ABNORMAL HIGH (ref 32.0–48.0)
pH, Arterial: 7.175 — CL (ref 7.350–7.450)
pO2, Arterial: 403 mmHg — ABNORMAL HIGH (ref 83.0–108.0)

## 2020-08-25 LAB — COMPREHENSIVE METABOLIC PANEL
ALT: 1271 U/L — ABNORMAL HIGH (ref 0–44)
AST: 2579 U/L — ABNORMAL HIGH (ref 15–41)
Albumin: 3.1 g/dL — ABNORMAL LOW (ref 3.5–5.0)
Alkaline Phosphatase: 72 U/L (ref 38–126)
Anion gap: 10 (ref 5–15)
BUN: 17 mg/dL (ref 6–20)
CO2: 21 mmol/L — ABNORMAL LOW (ref 22–32)
Calcium: 7.2 mg/dL — ABNORMAL LOW (ref 8.9–10.3)
Chloride: 111 mmol/L (ref 98–111)
Creatinine, Ser: 1.15 mg/dL — ABNORMAL HIGH (ref 0.44–1.00)
GFR, Estimated: >60 mL/min (ref >60)
Glucose, Bld: 110 mg/dL — ABNORMAL HIGH (ref 70–99)
Potassium: 2.9 mmol/L — ABNORMAL LOW (ref 3.5–5.1)
Sodium: 142 mmol/L (ref 135–145)
Total Bilirubin: 1.8 mg/dL — ABNORMAL HIGH (ref 0.3–1.2)
Total Protein: 5.6 g/dL — ABNORMAL LOW (ref 6.5–8.1)

## 2020-08-25 LAB — DIC (DISSEMINATED INTRAVASCULAR COAGULATION)PANEL
D-Dimer, Quant: >20 ug/mL-FEU — ABNORMAL HIGH (ref 0.00–0.50)
Fibrinogen: 156 mg/dL — ABNORMAL LOW (ref 210–475)
INR: 1.5 — ABNORMAL HIGH (ref 0.8–1.2)
Platelets: 94 10*3/uL — ABNORMAL LOW (ref 150–400)
Prothrombin Time: 18.2 seconds — ABNORMAL HIGH (ref 11.4–15.2)
Smear Review: NONE SEEN
aPTT: 34 seconds (ref 24–36)

## 2020-08-25 LAB — I-STAT VENOUS BLOOD GAS, ED
Acid-base deficit: 7 mmol/L — ABNORMAL HIGH (ref 0.0–2.0)
Bicarbonate: 21.2 mmol/L (ref 20.0–28.0)
Calcium, Ion: 1 mmol/L — ABNORMAL LOW (ref 1.15–1.40)
HCT: 35 % — ABNORMAL LOW (ref 36.0–46.0)
Hemoglobin: 11.9 g/dL — ABNORMAL LOW (ref 12.0–15.0)
O2 Saturation: 73 %
Patient temperature: 97.6
Potassium: 3.4 mmol/L — ABNORMAL LOW (ref 3.5–5.1)
Sodium: 142 mmol/L (ref 135–145)
TCO2: 23 mmol/L (ref 22–32)
pCO2, Ven: 53 mmHg (ref 44.0–60.0)
pH, Ven: 7.206 — ABNORMAL LOW (ref 7.250–7.430)
pO2, Ven: 46 mmHg — ABNORMAL HIGH (ref 32.0–45.0)

## 2020-08-25 LAB — CBC
HCT: 40.4 % (ref 36.0–46.0)
HCT: 32.4 % — ABNORMAL LOW (ref 36.0–46.0)
HCT: 29.8 % — ABNORMAL LOW (ref 36.0–46.0)
Hemoglobin: 13.4 g/dL (ref 12.0–15.0)
Hemoglobin: 10.9 g/dL — ABNORMAL LOW (ref 12.0–15.0)
Hemoglobin: 9.8 g/dL — ABNORMAL LOW (ref 12.0–15.0)
MCH: 28.3 pg (ref 26.0–34.0)
MCH: 27.9 pg (ref 26.0–34.0)
MCH: 27.7 pg (ref 26.0–34.0)
MCHC: 33.2 g/dL (ref 30.0–36.0)
MCHC: 33.6 g/dL (ref 30.0–36.0)
MCHC: 32.9 g/dL (ref 30.0–36.0)
MCV: 85.2 fL (ref 80.0–100.0)
MCV: 82.9 fL (ref 80.0–100.0)
MCV: 84.2 fL (ref 80.0–100.0)
Platelets: 90 10*3/uL — ABNORMAL LOW (ref 150–400)
Platelets: 129 10*3/uL — ABNORMAL LOW (ref 150–400)
Platelets: 113 10*3/uL — ABNORMAL LOW (ref 150–400)
RBC: 4.74 MIL/uL (ref 3.87–5.11)
RBC: 3.91 MIL/uL (ref 3.87–5.11)
RBC: 3.54 MIL/uL — ABNORMAL LOW (ref 3.87–5.11)
RDW: 14.1 % (ref 11.5–15.5)
RDW: 14.8 % (ref 11.5–15.5)
RDW: 14.8 % (ref 11.5–15.5)
WBC: 13.6 10*3/uL — ABNORMAL HIGH (ref 4.0–10.5)
WBC: 16.3 10*3/uL — ABNORMAL HIGH (ref 4.0–10.5)
WBC: 14 10*3/uL — ABNORMAL HIGH (ref 4.0–10.5)
nRBC: 0 % (ref 0.0–0.2)
nRBC: 0 % (ref 0.0–0.2)
nRBC: 0 % (ref 0.0–0.2)

## 2020-08-25 LAB — BPAM FFP
Blood Product Expiration Date: 202207052359
Blood Product Expiration Date: 202207072359
Blood Product Expiration Date: 202207072359
Blood Product Expiration Date: 202207112359
ISSUE DATE / TIME: 202207042249
ISSUE DATE / TIME: 202207042249
ISSUE DATE / TIME: 202207042251
ISSUE DATE / TIME: 202207042251
Unit Type and Rh: 6200
Unit Type and Rh: 6200
Unit Type and Rh: 6200
Unit Type and Rh: 6200

## 2020-08-25 LAB — TRAUMA TEG PANEL
CFF Max Amplitude: 11.4 mm — ABNORMAL LOW (ref 15–32)
Citrated Kaolin (R): 5.7 min (ref 4.6–9.1)
Citrated Rapid TEG (MA): 41.2 mm — ABNORMAL LOW (ref 52–70)
Lysis at 30 Minutes: 0 % (ref 0.0–2.6)

## 2020-08-25 LAB — URINALYSIS, MICROSCOPIC (REFLEX): RBC / HPF: >50 RBC/hpf (ref 0–5)

## 2020-08-25 LAB — BASIC METABOLIC PANEL
Anion gap: 10 (ref 5–15)
BUN: 18 mg/dL (ref 6–20)
CO2: 19 mmol/L — ABNORMAL LOW (ref 22–32)
Calcium: 6.9 mg/dL — ABNORMAL LOW (ref 8.9–10.3)
Chloride: 112 mmol/L — ABNORMAL HIGH (ref 98–111)
Creatinine, Ser: 1.1 mg/dL — ABNORMAL HIGH (ref 0.44–1.00)
GFR, Estimated: >60 mL/min (ref >60)
Glucose, Bld: 97 mg/dL (ref 70–99)
Potassium: 4.5 mmol/L (ref 3.5–5.1)
Sodium: 141 mmol/L (ref 135–145)

## 2020-08-25 LAB — POCT I-STAT 7, (LYTES, BLD GAS, ICA,H+H)
Acid-base deficit: 7 mmol/L — ABNORMAL HIGH (ref 0.0–2.0)
Bicarbonate: 16.8 mmol/L — ABNORMAL LOW (ref 20.0–28.0)
Calcium, Ion: 1.05 mmol/L — ABNORMAL LOW (ref 1.15–1.40)
HCT: 31 % — ABNORMAL LOW (ref 36.0–46.0)
Hemoglobin: 10.5 g/dL — ABNORMAL LOW (ref 12.0–15.0)
O2 Saturation: 100 %
Patient temperature: 37.9
Potassium: 2.9 mmol/L — ABNORMAL LOW (ref 3.5–5.1)
Sodium: 143 mmol/L (ref 135–145)
TCO2: 18 mmol/L — ABNORMAL LOW (ref 22–32)
pCO2 arterial: 29.4 mmHg — ABNORMAL LOW (ref 32.0–48.0)
pH, Arterial: 7.369 (ref 7.350–7.450)
pO2, Arterial: 180 mmHg — ABNORMAL HIGH (ref 83.0–108.0)

## 2020-08-25 LAB — PREPARE FRESH FROZEN PLASMA
Unit division: 0
Unit division: 0
Unit division: 0

## 2020-08-25 LAB — PROTIME-INR
INR: 1.5 — ABNORMAL HIGH (ref 0.8–1.2)
Prothrombin Time: 17.8 seconds — ABNORMAL HIGH (ref 11.4–15.2)

## 2020-08-25 LAB — MRSA NEXT GEN BY PCR, NASAL: MRSA by PCR Next Gen: NOT DETECTED

## 2020-08-25 LAB — PREPARE RBC (CROSSMATCH)

## 2020-08-25 LAB — HIV ANTIBODY (ROUTINE TESTING W REFLEX): HIV Screen 4th Generation wRfx: NONREACTIVE

## 2020-08-25 LAB — BLOOD PRODUCT ORDER (VERBAL) VERIFICATION

## 2020-08-25 LAB — ABO/RH: ABO/RH(D): A POS

## 2020-08-25 SURGERY — PINNING, SACROILIAC JOINT, PERCUTANEOUS
Anesthesia: Choice

## 2020-08-25 MED ORDER — MIDAZOLAM 50MG/50ML (1MG/ML) PREMIX INFUSION
0.0000 mg/h | INTRAVENOUS | Status: DC
  Administered 2020-08-25: 3 mg/h via INTRAVENOUS
  Administered 2020-08-25: 2 mg/h via INTRAVENOUS
  Administered 2020-08-26 – 2020-08-27 (×2): 4 mg/h via INTRAVENOUS
  Filled 2020-08-25 (×3): qty 50

## 2020-08-25 MED ORDER — POLYETHYLENE GLYCOL 3350 17 G PO PACK
17.0000 g | PACK | Freq: Every day | ORAL | Status: DC
  Administered 2020-08-26 – 2020-08-27 (×2): 17 g
  Filled 2020-08-25 (×2): qty 1

## 2020-08-25 MED ORDER — ACETAMINOPHEN 500 MG PO TABS
1000.0000 mg | ORAL_TABLET | Freq: Four times a day (QID) | ORAL | Status: DC
  Administered 2020-08-25 – 2020-08-27 (×7): 1000 mg
  Filled 2020-08-25 (×8): qty 2

## 2020-08-25 MED ORDER — PROCHLORPERAZINE EDISYLATE 10 MG/2ML IJ SOLN: 5.0000 mg | Freq: Four times a day (QID) | INTRAMUSCULAR | Status: DC | PRN

## 2020-08-25 MED ORDER — SODIUM CHLORIDE 0.9% FLUSH
10.0000 mL | Freq: Two times a day (BID) | INTRAVENOUS | Status: DC
Start: 2020-08-25 — End: 2020-09-03
  Administered 2020-08-26 – 2020-09-03 (×14): 10 mL

## 2020-08-25 MED ORDER — PHENYLEPHRINE HCL-NACL 10-0.9 MG/250ML-% IV SOLN
INTRAVENOUS | Status: AC
  Filled 2020-08-25: qty 250

## 2020-08-25 MED ORDER — DOCUSATE SODIUM 50 MG/5ML PO LIQD
100.0000 mg | Freq: Two times a day (BID) | ORAL | Status: DC
  Administered 2020-08-25 – 2020-08-27 (×4): 100 mg
  Filled 2020-08-25 (×5): qty 10

## 2020-08-25 MED ORDER — METHOCARBAMOL 500 MG PO TABS
1000.0000 mg | ORAL_TABLET | Freq: Three times a day (TID) | ORAL | Status: DC
  Filled 2020-08-25: qty 2

## 2020-08-25 MED ORDER — CHLORHEXIDINE GLUCONATE 4 % EX LIQD
60.0000 mL | Freq: Once | CUTANEOUS | Status: DC
  Filled 2020-08-25: qty 60

## 2020-08-25 MED ORDER — POTASSIUM CHLORIDE 10 MEQ/100ML IV SOLN
10.0000 meq | INTRAVENOUS | Status: AC
  Administered 2020-08-25 (×4): 10 meq via INTRAVENOUS
  Filled 2020-08-25 (×4): qty 100

## 2020-08-25 MED ORDER — ONDANSETRON 4 MG PO TBDP: 4.0000 mg | ORAL_TABLET | Freq: Four times a day (QID) | ORAL | Status: DC | PRN

## 2020-08-25 MED ORDER — FENTANYL 2500MCG IN NS 250ML (10MCG/ML) PREMIX INFUSION
50.0000 ug/h | INTRAVENOUS | Status: DC
  Administered 2020-08-25 – 2020-08-27 (×4): 200 ug/h via INTRAVENOUS
  Filled 2020-08-25 (×4): qty 250

## 2020-08-25 MED ORDER — CHLORHEXIDINE GLUCONATE CLOTH 2 % EX PADS
6.0000 | MEDICATED_PAD | Freq: Every day | CUTANEOUS | Status: DC
  Administered 2020-08-25 – 2020-08-30 (×7): 6 via TOPICAL

## 2020-08-25 MED ORDER — NOREPINEPHRINE 4 MG/250ML-% IV SOLN
INTRAVENOUS | Status: AC
  Filled 2020-08-25: qty 250

## 2020-08-25 MED ORDER — METHOCARBAMOL 500 MG PO TABS
1000.0000 mg | ORAL_TABLET | Freq: Three times a day (TID) | ORAL | Status: DC
  Administered 2020-08-25 – 2020-08-27 (×6): 1000 mg
  Filled 2020-08-25 (×7): qty 2

## 2020-08-25 MED ORDER — POTASSIUM CHLORIDE 10 MEQ/100ML IV SOLN
10.0000 meq | INTRAVENOUS | Status: AC
  Administered 2020-08-25 (×6): 10 meq via INTRAVENOUS
  Filled 2020-08-25 (×6): qty 100

## 2020-08-25 MED ORDER — NOREPINEPHRINE 4 MG/250ML-% IV SOLN
0.0000 ug/min | INTRAVENOUS | Status: DC
Start: 2020-08-25 — End: 2020-08-27
  Administered 2020-08-25: 2 ug/min via INTRAVENOUS
  Administered 2020-08-25: 5 ug/min via INTRAVENOUS
  Filled 2020-08-25: qty 250

## 2020-08-25 MED ORDER — SODIUM CHLORIDE 0.9 % IV SOLN: INTRAVENOUS | Status: DC | PRN

## 2020-08-25 MED ORDER — MIDAZOLAM BOLUS VIA INFUSION
0.0000 mg | INTRAVENOUS | Status: DC | PRN
  Filled 2020-08-25: qty 5

## 2020-08-25 MED ORDER — PROCHLORPERAZINE MALEATE 10 MG PO TABS
10.0000 mg | ORAL_TABLET | Freq: Four times a day (QID) | ORAL | Status: DC | PRN
  Filled 2020-08-25: qty 1

## 2020-08-25 MED ORDER — SODIUM CHLORIDE 0.9 % IV SOLN: INTRAVENOUS | Status: DC

## 2020-08-25 MED ORDER — PANTOPRAZOLE SODIUM 40 MG IV SOLR
40.0000 mg | Freq: Every day | INTRAVENOUS | Status: DC
  Administered 2020-08-25: 40 mg via INTRAVENOUS
  Filled 2020-08-25: qty 40

## 2020-08-25 MED ORDER — CEFAZOLIN SODIUM-DEXTROSE 2-4 GM/100ML-% IV SOLN: 2.0000 g | INTRAVENOUS | Status: DC

## 2020-08-25 MED ORDER — ORAL CARE MOUTH RINSE
15.0000 mL | OROMUCOSAL | Status: DC
  Administered 2020-08-25 – 2020-08-27 (×23): 15 mL via OROMUCOSAL

## 2020-08-25 MED ORDER — FENTANYL BOLUS VIA INFUSION
50.0000 ug | INTRAVENOUS | Status: DC | PRN
  Filled 2020-08-25: qty 100

## 2020-08-25 MED ORDER — SODIUM CHLORIDE 0.9% IV SOLUTION: Freq: Once | INTRAVENOUS | Status: DC

## 2020-08-25 MED ORDER — ONDANSETRON HCL 4 MG/2ML IJ SOLN
4.0000 mg | Freq: Four times a day (QID) | INTRAMUSCULAR | Status: DC | PRN
  Administered 2020-08-27 – 2020-09-01 (×2): 4 mg via INTRAVENOUS
  Filled 2020-08-25 (×2): qty 2

## 2020-08-25 MED ORDER — PHENYLEPHRINE HCL-NACL 10-0.9 MG/250ML-% IV SOLN
0.0000 ug/min | INTRAVENOUS | Status: AC
  Administered 2020-08-25: 180 ug/min via INTRAVENOUS
  Administered 2020-08-25: 25 ug/min via INTRAVENOUS
  Administered 2020-08-25: 150 ug/min via INTRAVENOUS
  Administered 2020-08-25: 170 ug/min via INTRAVENOUS
  Administered 2020-08-25 (×2): 150 ug/min via INTRAVENOUS
  Filled 2020-08-25 (×2): qty 500
  Filled 2020-08-25 (×2): qty 250

## 2020-08-25 MED ORDER — FENTANYL CITRATE (PF) 100 MCG/2ML IJ SOLN: 50.0000 ug | Freq: Once | INTRAMUSCULAR | Status: DC

## 2020-08-25 MED ORDER — PANTOPRAZOLE SODIUM 40 MG PO TBEC: 40.0000 mg | DELAYED_RELEASE_TABLET | Freq: Every day | ORAL | Status: DC

## 2020-08-25 MED ORDER — CHLORHEXIDINE GLUCONATE 0.12% ORAL RINSE (MEDLINE KIT)
15.0000 mL | Freq: Two times a day (BID) | OROMUCOSAL | Status: DC
  Administered 2020-08-25 – 2020-09-02 (×13): 15 mL via OROMUCOSAL

## 2020-08-25 MED ORDER — CALCIUM GLUCONATE-NACL 1-0.675 GM/50ML-% IV SOLN
1.0000 g | Freq: Once | INTRAVENOUS | Status: AC
  Administered 2020-08-25: 1000 mg via INTRAVENOUS
  Filled 2020-08-25: qty 50

## 2020-08-25 MED ORDER — SODIUM CHLORIDE 0.9 % IV SOLN: Freq: Once | INTRAVENOUS | Status: AC

## 2020-08-25 MED ORDER — LEVETIRACETAM IN NACL 1000 MG/100ML IV SOLN
1000.0000 mg | Freq: Two times a day (BID) | INTRAVENOUS | Status: DC
  Administered 2020-08-25 – 2020-08-31 (×13): 1000 mg via INTRAVENOUS
  Filled 2020-08-25 (×14): qty 100

## 2020-08-25 MED ORDER — POTASSIUM CHLORIDE 20 MEQ PO PACK: 40.0000 meq | PACK | Freq: Once | ORAL | Status: DC

## 2020-08-25 MED ORDER — ACETAMINOPHEN 325 MG PO TABS: 650.0000 mg | ORAL_TABLET | ORAL | Status: DC | PRN

## 2020-08-25 MED ORDER — POVIDONE-IODINE 10 % EX SWAB: 2.0000 "application " | Freq: Once | CUTANEOUS | Status: DC

## 2020-08-25 MED ORDER — SODIUM CHLORIDE 0.9% FLUSH: 10.0000 mL | INTRAVENOUS | Status: DC | PRN

## 2020-08-25 MED ORDER — OXYCODONE HCL 5 MG/5ML PO SOLN
5.0000 mg | ORAL | Status: DC | PRN
  Administered 2020-08-27: 10 mg
  Filled 2020-08-25 (×3): qty 10

## 2020-08-25 MED ORDER — CHLORHEXIDINE GLUCONATE 0.12% ORAL RINSE (MEDLINE KIT)
15.0000 mL | Freq: Two times a day (BID) | OROMUCOSAL | Status: DC
  Administered 2020-08-25: 15 mL via OROMUCOSAL
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
## 2020-08-25 NOTE — Progress Notes (Signed)
Peripherally Inserted Central Catheter Placement  The IV Nurse has discussed with the patient and/or persons authorized to consent for the patient, the purpose of this procedure and the potential benefits and risks involved with this procedure.  The benefits include less needle sticks, lab draws from the catheter, and the patient may be discharged home with the catheter. Risks include, but not limited to, infection, bleeding, blood clot (thrombus formation), and puncture of an artery; nerve damage and irregular heartbeat and possibility to perform a PICC exchange if needed/ordered by physician.  Alternatives to this procedure were also discussed.  Bard Power PICC patient education guide, fact sheet on infection prevention and patient information card has been provided to patient /or left at bedside.    PICC Placement Documentation  PICC Triple Lumen 08/25/20 PICC Right Basilic 33 cm 0 cm (Active)  Indication for Insertion or Continuance of Line Prolonged intravenous therapies 08/25/20 1658  Exposed Catheter (cm) 0 cm 08/25/20 1658  Site Assessment Clean;Dry;Intact 08/25/20 1658  Lumen #1 Status Flushed;Blood return noted;Saline locked 08/25/20 1658  Lumen #2 Status Flushed;Blood return noted;Saline locked 08/25/20 1658  Lumen #3 Status Flushed;Blood return noted;Saline locked 08/25/20 1658  Dressing Type Transparent 08/25/20 1658  Dressing Status Clean;Dry;Intact 08/25/20 1658  Antimicrobial disc in place? Yes 08/25/20 1658  Dressing Change Due 09/01/20 08/25/20 1658       Audrie Gallus 08/25/2020, 5:00 PM

## 2020-08-25 NOTE — Progress Notes (Signed)
RT transported patient to CT and back to 4N without any complications. 
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
## 2020-08-25 NOTE — Anesthesia Preprocedure Evaluation (Signed)
Anesthesia Evaluation  Patient identified by MRN, date of birth, ID band Patient awake    Reviewed: Allergy & Precautions, H&P , NPO status , Patient's Chart, lab work & pertinent test results, reviewed documented beta blocker date and time   Airway Mallampati: Intubated       Dental no notable dental hx.    Pulmonary neg pulmonary ROS,    Pulmonary exam normal breath sounds clear to auscultation       Cardiovascular Exercise Tolerance: Good negative cardio ROS   Rhythm:regular Rate:Normal     Neuro/Psych negative neurological ROS  negative psych ROS   GI/Hepatic negative GI ROS, Neg liver ROS,   Endo/Other  negative endocrine ROS  Renal/GU negative Renal ROS  negative genitourinary   Musculoskeletal   Abdominal   Peds  Hematology negative hematology ROS (+)   Anesthesia Other Findings   Reproductive/Obstetrics negative OB ROS                             Anesthesia Physical Anesthesia Plan  ASA: 4  Anesthesia Plan: General   Post-op Pain Management:    Induction: Intravenous  PONV Risk Score and Plan: 3 and Ondansetron and Treatment may vary due to age or medical condition  Airway Management Planned: Oral ETT  Additional Equipment: None  Intra-op Plan:   Post-operative Plan: Post-operative intubation/ventilation  Informed Consent: I have reviewed the patients History and Physical, chart, labs and discussed the procedure including the risks, benefits and alternatives for the proposed anesthesia with the patient or authorized representative who has indicated his/her understanding and acceptance.     Dental Advisory Given  Plan Discussed with: CRNA and Anesthesiologist  Anesthesia Plan Comments:         Anesthesia Quick Evaluation
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
## 2020-08-25 NOTE — Progress Notes (Signed)
Blood consent form signed by patient's Shannon Hogan [phone]). Sherral Hammers RN witnessed signature.
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
## 2020-08-25 NOTE — ED Notes (Signed)
Pt cleaned.  Large amount of diarrhea noted
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
## 2020-08-25 NOTE — Progress Notes (Signed)
Called by Dr. Donell Beers for pelvic fx. Imaging reviewed; patient has LC1 pelvis with Maurine Minister III sacrum fx. No open book or vertical shear injury. Binder not indicated. Will review with ortho trauma in the am. Full consult to follow.

## 2020-08-26 ENCOUNTER — Inpatient Hospital Stay (HOSPITAL_COMMUNITY): Payer: Medicaid Other

## 2020-08-26 ENCOUNTER — Encounter (HOSPITAL_COMMUNITY): Admission: EM | Disposition: A | Discharge status: Home Health | Payer: Self-pay | Source: Home / Self Care

## 2020-08-26 ENCOUNTER — Inpatient Hospital Stay (HOSPITAL_COMMUNITY): Payer: Medicaid Other | Admitting: Anesthesiology

## 2020-08-26 DIAGNOSIS — Z9911 Dependence on respirator [ventilator] status: Secondary | ICD-10-CM | POA: Diagnosis not present

## 2020-08-26 DIAGNOSIS — S37001A Unspecified injury of right kidney, initial encounter: Secondary | ICD-10-CM | POA: Diagnosis not present

## 2020-08-26 DIAGNOSIS — S32511A Fracture of superior rim of right pubis, initial encounter for closed fracture: Secondary | ICD-10-CM | POA: Diagnosis not present

## 2020-08-26 DIAGNOSIS — S37819A Unspecified injury of adrenal gland, initial encounter: Secondary | ICD-10-CM | POA: Diagnosis not present

## 2020-08-26 DIAGNOSIS — S36119A Unspecified injury of liver, initial encounter: Secondary | ICD-10-CM | POA: Diagnosis not present

## 2020-08-26 DIAGNOSIS — S270XXA Traumatic pneumothorax, initial encounter: Secondary | ICD-10-CM | POA: Diagnosis not present

## 2020-08-26 DIAGNOSIS — J9621 Acute and chronic respiratory failure with hypoxia: Secondary | ICD-10-CM | POA: Diagnosis not present

## 2020-08-26 DIAGNOSIS — S2243XA Multiple fractures of ribs, bilateral, initial encounter for closed fracture: Secondary | ICD-10-CM | POA: Diagnosis not present

## 2020-08-26 DIAGNOSIS — S062X9A Diffuse traumatic brain injury with loss of consciousness of unspecified duration, initial encounter: Secondary | ICD-10-CM | POA: Diagnosis not present

## 2020-08-26 DIAGNOSIS — S36115A Moderate laceration of liver, initial encounter: Secondary | ICD-10-CM | POA: Diagnosis not present

## 2020-08-26 DIAGNOSIS — R578 Other shock: Secondary | ICD-10-CM | POA: Diagnosis not present

## 2020-08-26 DIAGNOSIS — S32501A Unspecified fracture of right pubis, initial encounter for closed fracture: Secondary | ICD-10-CM | POA: Diagnosis not present

## 2020-08-26 DIAGNOSIS — S329XXA Fracture of unspecified parts of lumbosacral spine and pelvis, initial encounter for closed fracture: Secondary | ICD-10-CM | POA: Diagnosis not present

## 2020-08-26 DIAGNOSIS — D62 Acute posthemorrhagic anemia: Secondary | ICD-10-CM | POA: Diagnosis not present

## 2020-08-26 DIAGNOSIS — S3216XA Type 3 fracture of sacrum, initial encounter for closed fracture: Secondary | ICD-10-CM | POA: Diagnosis not present

## 2020-08-26 DIAGNOSIS — S37061A Major laceration of right kidney, initial encounter: Secondary | ICD-10-CM | POA: Diagnosis not present

## 2020-08-26 DIAGNOSIS — S2242XA Multiple fractures of ribs, left side, initial encounter for closed fracture: Secondary | ICD-10-CM | POA: Diagnosis not present

## 2020-08-26 DIAGNOSIS — J9601 Acute respiratory failure with hypoxia: Secondary | ICD-10-CM | POA: Diagnosis not present

## 2020-08-26 DIAGNOSIS — Z9889 Other specified postprocedural states: Secondary | ICD-10-CM | POA: Diagnosis not present

## 2020-08-26 DIAGNOSIS — E876 Hypokalemia: Secondary | ICD-10-CM | POA: Diagnosis not present

## 2020-08-26 DIAGNOSIS — M533 Sacrococcygeal disorders, not elsewhere classified: Secondary | ICD-10-CM | POA: Diagnosis not present

## 2020-08-26 DIAGNOSIS — J90 Pleural effusion, not elsewhere classified: Secondary | ICD-10-CM | POA: Diagnosis not present

## 2020-08-26 DIAGNOSIS — Z01818 Encounter for other preprocedural examination: Secondary | ICD-10-CM | POA: Diagnosis not present

## 2020-08-26 DIAGNOSIS — Z20822 Contact with and (suspected) exposure to covid-19: Secondary | ICD-10-CM | POA: Diagnosis not present

## 2020-08-26 HISTORY — PX: SACRO-ILIAC PINNING: SHX5050

## 2020-08-26 LAB — COMPREHENSIVE METABOLIC PANEL
ALT: 1071 U/L — ABNORMAL HIGH (ref 0–44)
AST: 1134 U/L — ABNORMAL HIGH (ref 15–41)
Albumin: 2.4 g/dL — ABNORMAL LOW (ref 3.5–5.0)
Alkaline Phosphatase: 54 U/L (ref 38–126)
Anion gap: 6 (ref 5–15)
BUN: 14 mg/dL (ref 6–20)
CO2: 19 mmol/L — ABNORMAL LOW (ref 22–32)
Calcium: 7 mg/dL — ABNORMAL LOW (ref 8.9–10.3)
Chloride: 114 mmol/L — ABNORMAL HIGH (ref 98–111)
Creatinine, Ser: 0.95 mg/dL (ref 0.44–1.00)
GFR, Estimated: >60 mL/min (ref >60)
Glucose, Bld: 103 mg/dL — ABNORMAL HIGH (ref 70–99)
Potassium: 3.9 mmol/L (ref 3.5–5.1)
Sodium: 139 mmol/L (ref 135–145)
Total Bilirubin: 1.9 mg/dL — ABNORMAL HIGH (ref 0.3–1.2)
Total Protein: 4.6 g/dL — ABNORMAL LOW (ref 6.5–8.1)

## 2020-08-26 LAB — CBC
HCT: 31.5 % — ABNORMAL LOW (ref 36.0–46.0)
HCT: 27.5 % — ABNORMAL LOW (ref 36.0–46.0)
HCT: 26.2 % — ABNORMAL LOW (ref 36.0–46.0)
HCT: 24.1 % — ABNORMAL LOW (ref 36.0–46.0)
Hemoglobin: 10.4 g/dL — ABNORMAL LOW (ref 12.0–15.0)
Hemoglobin: 8.9 g/dL — ABNORMAL LOW (ref 12.0–15.0)
Hemoglobin: 8.7 g/dL — ABNORMAL LOW (ref 12.0–15.0)
Hemoglobin: 7.8 g/dL — ABNORMAL LOW (ref 12.0–15.0)
MCH: 27.8 pg (ref 26.0–34.0)
MCH: 28 pg (ref 26.0–34.0)
MCH: 28.4 pg (ref 26.0–34.0)
MCH: 28.2 pg (ref 26.0–34.0)
MCHC: 33 g/dL (ref 30.0–36.0)
MCHC: 32.4 g/dL (ref 30.0–36.0)
MCHC: 33.2 g/dL (ref 30.0–36.0)
MCHC: 32.4 g/dL (ref 30.0–36.0)
MCV: 84.2 fL (ref 80.0–100.0)
MCV: 86.5 fL (ref 80.0–100.0)
MCV: 85.6 fL (ref 80.0–100.0)
MCV: 87 fL (ref 80.0–100.0)
Platelets: 104 10*3/uL — ABNORMAL LOW (ref 150–400)
Platelets: 92 10*3/uL — ABNORMAL LOW (ref 150–400)
Platelets: 88 10*3/uL — ABNORMAL LOW (ref 150–400)
Platelets: 89 10*3/uL — ABNORMAL LOW (ref 150–400)
RBC: 3.74 MIL/uL — ABNORMAL LOW (ref 3.87–5.11)
RBC: 3.18 MIL/uL — ABNORMAL LOW (ref 3.87–5.11)
RBC: 3.06 MIL/uL — ABNORMAL LOW (ref 3.87–5.11)
RBC: 2.77 MIL/uL — ABNORMAL LOW (ref 3.87–5.11)
RDW: 15 % (ref 11.5–15.5)
RDW: 15.2 % (ref 11.5–15.5)
RDW: 15.2 % (ref 11.5–15.5)
RDW: 15.4 % (ref 11.5–15.5)
WBC: 12.7 10*3/uL — ABNORMAL HIGH (ref 4.0–10.5)
WBC: 12.1 10*3/uL — ABNORMAL HIGH (ref 4.0–10.5)
WBC: 12.4 10*3/uL — ABNORMAL HIGH (ref 4.0–10.5)
WBC: 13.3 10*3/uL — ABNORMAL HIGH (ref 4.0–10.5)
nRBC: 0 % (ref 0.0–0.2)
nRBC: 0 % (ref 0.0–0.2)
nRBC: 0 % (ref 0.0–0.2)
nRBC: 0 % (ref 0.0–0.2)

## 2020-08-26 LAB — BPAM PLATELET PHERESIS
Blood Product Expiration Date: 202207072359
ISSUE DATE / TIME: 202207050952
Unit Type and Rh: 6200

## 2020-08-26 LAB — PREPARE PLATELET PHERESIS: Unit division: 0

## 2020-08-26 LAB — CALCIUM, IONIZED: Calcium, Ionized, Serum: 3.9 mg/dL — ABNORMAL LOW (ref 4.5–5.6)

## 2020-08-26 LAB — PREPARE RBC (CROSSMATCH)

## 2020-08-26 SURGERY — PINNING, SACROILIAC JOINT, PERCUTANEOUS
Anesthesia: General

## 2020-08-26 MED ORDER — SODIUM CHLORIDE 0.9 % IV BOLUS
1000.0000 mL | Freq: Once | INTRAVENOUS | Status: AC
  Administered 2020-08-26: 1000 mL via INTRAVENOUS

## 2020-08-26 MED ORDER — FENTANYL CITRATE (PF) 250 MCG/5ML IJ SOLN
INTRAMUSCULAR | Status: AC
  Filled 2020-08-26: qty 5

## 2020-08-26 MED ORDER — PHENYLEPHRINE HCL (PRESSORS) 10 MG/ML IV SOLN
INTRAVENOUS | Status: DC | PRN
  Administered 2020-08-26 (×3): 100 ug via INTRAVENOUS

## 2020-08-26 MED ORDER — LACTATED RINGERS IV SOLN: INTRAVENOUS | Status: DC | PRN

## 2020-08-26 MED ORDER — PANTOPRAZOLE SODIUM 40 MG PO PACK
40.0000 mg | PACK | Freq: Every day | ORAL | Status: DC
  Administered 2020-08-26 – 2020-08-27 (×2): 40 mg
  Filled 2020-08-26 (×2): qty 20

## 2020-08-26 MED ORDER — CEFAZOLIN SODIUM 1 G IJ SOLR
INTRAMUSCULAR | Status: AC
  Filled 2020-08-26: qty 20

## 2020-08-26 MED ORDER — 0.9 % SODIUM CHLORIDE (POUR BTL) OPTIME
TOPICAL | Status: DC | PRN
  Administered 2020-08-26: 1000 mL

## 2020-08-26 MED ORDER — FENTANYL CITRATE (PF) 250 MCG/5ML IJ SOLN
INTRAMUSCULAR | Status: DC | PRN
  Administered 2020-08-26 (×2): 25 ug via INTRAVENOUS

## 2020-08-26 MED ORDER — CEFAZOLIN SODIUM-DEXTROSE 2-3 GM-%(50ML) IV SOLR
INTRAVENOUS | Status: DC | PRN
  Administered 2020-08-26: 2 g via INTRAVENOUS

## 2020-08-26 MED ORDER — CEFAZOLIN SODIUM-DEXTROSE 2-4 GM/100ML-% IV SOLN
2.0000 g | Freq: Three times a day (TID) | INTRAVENOUS | Status: AC
  Administered 2020-08-26 – 2020-08-27 (×3): 2 g via INTRAVENOUS
  Filled 2020-08-26 (×3): qty 100

## 2020-08-26 MED ORDER — ENOXAPARIN SODIUM 30 MG/0.3ML IJ SOSY
30.0000 mg | PREFILLED_SYRINGE | Freq: Two times a day (BID) | INTRAMUSCULAR | Status: DC
  Administered 2020-08-26 – 2020-09-03 (×16): 30 mg via SUBCUTANEOUS
  Filled 2020-08-26 (×16): qty 0.3

## 2020-08-26 SURGICAL SUPPLY — 48 items
BAG COUNTER SPONGE SURGICOUNT (BAG) ×2 IMPLANT
BIT DRILL CANN 4.5MM (BIT) IMPLANT
BRUSH SCRUB EZ PLAIN DRY (MISCELLANEOUS) ×2 IMPLANT
CHLORAPREP W/TINT 26 (MISCELLANEOUS) ×2 IMPLANT
COVER SURGICAL LIGHT HANDLE (MISCELLANEOUS) ×2 IMPLANT
DERMABOND ADVANCED (GAUZE/BANDAGES/DRESSINGS) ×1
DERMABOND ADVANCED .7 DNX12 (GAUZE/BANDAGES/DRESSINGS) IMPLANT
DRAPE C-ARM 42X72 X-RAY (DRAPES) IMPLANT
DRAPE C-ARMOR (DRAPES) ×2 IMPLANT
DRAPE INCISE IOBAN 66X45 STRL (DRAPES) ×2 IMPLANT
DRAPE INCISE IOBAN 85X60 (DRAPES) ×1 IMPLANT
DRAPE LAPAROTOMY TRNSV 102X78 (DRAPES) ×2 IMPLANT
DRAPE U-SHAPE 47X51 STRL (DRAPES) ×2 IMPLANT
DRESSING MEPILEX FLEX 4X4 (GAUZE/BANDAGES/DRESSINGS) IMPLANT
DRILL BIT CANN 4.5MM (BIT) ×2
DRSG MEPILEX BORDER 4X4 (GAUZE/BANDAGES/DRESSINGS) ×1 IMPLANT
DRSG MEPILEX FLEX 4X4 (GAUZE/BANDAGES/DRESSINGS) ×2
DRSG TEGADERM 4X4.75 (GAUZE/BANDAGES/DRESSINGS) ×1 IMPLANT
ELECT REM PT RETURN 9FT ADLT (ELECTROSURGICAL) ×2
ELECTRODE REM PT RTRN 9FT ADLT (ELECTROSURGICAL) ×1 IMPLANT
GAUZE SPONGE 2X2 8PLY STRL LF (GAUZE/BANDAGES/DRESSINGS) IMPLANT
GLOVE SURG ENC MOIS LTX SZ6.5 (GLOVE) ×6 IMPLANT
GLOVE SURG ENC MOIS LTX SZ7.5 (GLOVE) ×8 IMPLANT
GLOVE SURG UNDER POLY LF SZ6.5 (GLOVE) ×2 IMPLANT
GLOVE SURG UNDER POLY LF SZ7.5 (GLOVE) ×2 IMPLANT
GOWN STRL REUS W/ TWL LRG LVL3 (GOWN DISPOSABLE) ×2 IMPLANT
GOWN STRL REUS W/TWL LRG LVL3 (GOWN DISPOSABLE) ×2
GUIDEWIRE 2.0MM (WIRE) ×3 IMPLANT
GUIDEWIRE THREADED 2.8MM (WIRE) ×2 IMPLANT
KIT BASIN OR (CUSTOM PROCEDURE TRAY) ×2 IMPLANT
KIT TURNOVER KIT B (KITS) ×2 IMPLANT
MANIFOLD NEPTUNE II (INSTRUMENTS) ×1 IMPLANT
NS IRRIG 1000ML POUR BTL (IV SOLUTION) ×2 IMPLANT
PACK GENERAL/GYN (CUSTOM PROCEDURE TRAY) ×2 IMPLANT
PAD ARMBOARD 7.5X6 YLW CONV (MISCELLANEOUS) ×4 IMPLANT
SCREW CANN FT 6.5X60 (Screw) ×1 IMPLANT
SCREW CANN FT 6.5X70 FT (Screw) ×1 IMPLANT
SCREW CANN THRD 6.5X125 (Screw) ×1 IMPLANT
SPONGE GAUZE 2X2 STER 10/PKG (GAUZE/BANDAGES/DRESSINGS) ×1
STAPLER VISISTAT 35W (STAPLE) ×2 IMPLANT
SUT ETHILON 3 0 PS 1 (SUTURE) ×2 IMPLANT
SUT MNCRL AB 3-0 PS2 18 (SUTURE) ×2 IMPLANT
SUT VIC AB 2-0 FS1 27 (SUTURE) ×2 IMPLANT
TOWEL GREEN STERILE (TOWEL DISPOSABLE) ×4 IMPLANT
TOWEL GREEN STERILE FF (TOWEL DISPOSABLE) ×2 IMPLANT
UNDERPAD 30X36 HEAVY ABSORB (UNDERPADS AND DIAPERS) ×2 IMPLANT
WASHER FOR 5.0 SCREWS (Washer) ×2 IMPLANT
WATER STERILE IRR 1000ML POUR (IV SOLUTION) ×1 IMPLANT

## 2020-08-26 NOTE — Progress Notes (Signed)
Patient's next of kin, mother and father, are at bedside. Mother requested that the blood consent and surgical procedure consent form be redone so she could sign instead of the ones signed by patient's fiance. I rewrote the consent forms and witnessed her sign them and demonstrate understanding of the forms. Pelvic surgery consent form at bedside and blood consent form in patient's paper chart.  *MOTHER REQUESTS SHE IS THE POINT OF CONTACT FROM NOW ON*  Shannon Hogan (mother)  [phone]  Shannon Hogan (father)  [phone]   Sherral Hammers RN

## 2020-08-26 NOTE — Progress Notes (Signed)
CXR reviewed, ETT is at the carina. Ordered placed to retract tube by 2cm.

## 2020-08-26 NOTE — Progress Notes (Signed)
Mepilex foam pads placed under miami J c collar to prevent skin breakdown. Skin now looks good. Will continue to monitor.    Sherral Hammers RN
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
## 2020-08-26 NOTE — Interval H&P Note (Signed)
History and Physical Interval Note:  08/26/2020 2:29 PM  Shannon Hogan  has presented today for surgery, with the diagnosis of pelvis fracture.  The various methods of treatment have been discussed with the patient and family. After consideration of risks, benefits and other options for treatment, the patient has consented to  Percutaneous fixation of pelvis as a surgical intervention.  The patient's history has been reviewed, patient examined, no change in status, stable for surgery.  I have reviewed the patient's chart and labs.  Questions were answered to the patient's satisfaction.     Caryn Bee P Kaito Schulenburg
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
## 2020-08-26 NOTE — Progress Notes (Signed)
Leafy Ro RN and Sherral Hammers RN took out patient's septum piercing and hair jewelry at this time. Jewelry put in specimen cup with patient's name label and given to father. No other jewelry noted at this time. Pre-procedure check list completed.
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
## 2020-08-26 NOTE — Transfer of Care (Signed)
Immediate Anesthesia Transfer of Care Note  Patient: Shannon Hogan  Procedure(s) Performed: Loyal Gambler  Patient Location: ICU  Anesthesia Type:General  Level of Consciousness: Patient remains intubated per anesthesia plan  Airway & Oxygen Therapy: Patient remains intubated per anesthesia plan and Patient placed on Ventilator (see vital sign flow sheet for setting)  Post-op Assessment: Report given to RN  Post vital signs: Reviewed and stable  Last Vitals:  Vitals Value Taken Time  BP 121/75 08/26/20 1655  Temp 36.3 C 08/26/20 1659  Pulse 99 08/26/20 1659  Resp 14 08/26/20 1659  SpO2 98 % 08/26/20 1659  Vitals shown include unvalidated device data.  Last Pain:  Vitals:   08/26/20 1200  TempSrc: Bladder         Complications: No notable events documented.

## 2020-08-26 NOTE — Progress Notes (Signed)
Spoke to patient's mother on the phone regarding updates on patient. All questions answered. Patient's mom and dad will be coming up to visit today.   Sherral Hammers, RN

## 2020-08-26 NOTE — Progress Notes (Signed)
[facility] radiology called me to report patient's chest xray this AM. I gave them Dr. Eliot Ford pager number since trauma is the primary service for patient.
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
## 2020-08-26 NOTE — Progress Notes (Signed)
Spoke with Cala Bradford RT at this time regarding arterial line placement. She will insert arterial line as soon as she can.  Sherral Hammers, RN

## 2020-08-26 NOTE — Procedures (Signed)
Arterial Catheter Insertion Procedure Note  Shannon Hogan  [id number]  06/06/2000  Date:08/26/20  Time:9:09 AM    Provider Performing: Newt Lukes    Procedure: Insertion of Arterial Line (36629) without US guidance  Indication(s) Blood pressure monitoring and/or need for frequent ABGs  Consent Unable to obtain consent due to inability to find a medical decision maker for patient.  All reasonable efforts were made.  Another independent medical provider, Doretha Imus RRT , confirmed the benefits of this procedure outweigh the risks.  Anesthesia None   Time Out Verified patient identification, verified procedure, site/side was marked, verified correct patient position, special equipment/implants available, medications/allergies/relevant history reviewed, required imaging and test results available.   Sterile Technique Maximal sterile technique including full sterile barrier drape, hand hygiene, sterile gown, sterile gloves, mask, hair covering, sterile ultrasound probe cover (if used).   Procedure Description Area of catheter insertion was cleaned with chlorhexidine and draped in sterile fashion. With real-time ultrasound guidance an arterial catheter was placed into the left radial artery.  Appropriate arterial tracings confirmed on monitor.     Complications/Tolerance None; patient tolerated the procedure well.   EBL Minimal   Specimen(s) None

## 2020-08-26 NOTE — Progress Notes (Signed)
RT note-vent in standby, patient is in the OR.

## 2020-08-26 NOTE — Progress Notes (Signed)
Patient arrived back to 4N ICU at 1650 with no complications. VSS. I called patient's father, Bettie Capistran, to update him that patient is back from OR and is stable. He is on the way up now. I placed all completed consent forms in patient's paper chart.

## 2020-08-26 NOTE — Progress Notes (Addendum)
Patient taken to OR at this time by CRNA and 2 OR RN's. Consent given to OR RN. Pre-procedure completed. VSS.    Sherral Hammers RN
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
## 2020-08-26 NOTE — Progress Notes (Signed)
I spoke to patient's fiance, Mechele Collin, over the phone at this time regarding patient updates and when she is going down for surgery. I just looked at the [facility] OR daily schedule and I told him that I don't see her scheduled yet but I will call him once I know more. All questions answered. VSS.   Sherral Hammers, RN

## 2020-08-27 ENCOUNTER — Encounter (HOSPITAL_COMMUNITY): Payer: Self-pay | Admitting: Student

## 2020-08-27 ENCOUNTER — Inpatient Hospital Stay (HOSPITAL_COMMUNITY): Payer: Medicaid Other

## 2020-08-27 DIAGNOSIS — Z9911 Dependence on respirator [ventilator] status: Secondary | ICD-10-CM | POA: Diagnosis not present

## 2020-08-27 DIAGNOSIS — S37001A Unspecified injury of right kidney, initial encounter: Secondary | ICD-10-CM | POA: Diagnosis not present

## 2020-08-27 DIAGNOSIS — J9601 Acute respiratory failure with hypoxia: Secondary | ICD-10-CM | POA: Diagnosis not present

## 2020-08-27 DIAGNOSIS — E876 Hypokalemia: Secondary | ICD-10-CM | POA: Diagnosis not present

## 2020-08-27 DIAGNOSIS — R578 Other shock: Secondary | ICD-10-CM | POA: Diagnosis not present

## 2020-08-27 DIAGNOSIS — S37819A Unspecified injury of adrenal gland, initial encounter: Secondary | ICD-10-CM | POA: Diagnosis not present

## 2020-08-27 DIAGNOSIS — S3216XA Type 3 fracture of sacrum, initial encounter for closed fracture: Secondary | ICD-10-CM | POA: Diagnosis not present

## 2020-08-27 DIAGNOSIS — S36119A Unspecified injury of liver, initial encounter: Secondary | ICD-10-CM | POA: Diagnosis not present

## 2020-08-27 DIAGNOSIS — J9690 Respiratory failure, unspecified, unspecified whether with hypoxia or hypercapnia: Secondary | ICD-10-CM | POA: Diagnosis not present

## 2020-08-27 DIAGNOSIS — J9621 Acute and chronic respiratory failure with hypoxia: Secondary | ICD-10-CM | POA: Diagnosis not present

## 2020-08-27 DIAGNOSIS — J90 Pleural effusion, not elsewhere classified: Secondary | ICD-10-CM | POA: Diagnosis not present

## 2020-08-27 DIAGNOSIS — S270XXA Traumatic pneumothorax, initial encounter: Secondary | ICD-10-CM | POA: Diagnosis not present

## 2020-08-27 DIAGNOSIS — S2243XA Multiple fractures of ribs, bilateral, initial encounter for closed fracture: Secondary | ICD-10-CM | POA: Diagnosis not present

## 2020-08-27 DIAGNOSIS — S37061A Major laceration of right kidney, initial encounter: Secondary | ICD-10-CM | POA: Diagnosis not present

## 2020-08-27 DIAGNOSIS — S32511A Fracture of superior rim of right pubis, initial encounter for closed fracture: Secondary | ICD-10-CM | POA: Diagnosis not present

## 2020-08-27 DIAGNOSIS — D62 Acute posthemorrhagic anemia: Secondary | ICD-10-CM | POA: Diagnosis not present

## 2020-08-27 DIAGNOSIS — S2242XA Multiple fractures of ribs, left side, initial encounter for closed fracture: Secondary | ICD-10-CM | POA: Diagnosis not present

## 2020-08-27 DIAGNOSIS — S36115A Moderate laceration of liver, initial encounter: Secondary | ICD-10-CM | POA: Diagnosis not present

## 2020-08-27 DIAGNOSIS — Z20822 Contact with and (suspected) exposure to covid-19: Secondary | ICD-10-CM | POA: Diagnosis not present

## 2020-08-27 LAB — VITAMIN D 25 HYDROXY (VIT D DEFICIENCY, FRACTURES): Vit D, 25-Hydroxy: 12.99 ng/mL — ABNORMAL LOW (ref 30–100)

## 2020-08-27 MED ORDER — FUROSEMIDE 10 MG/ML IJ SOLN
40.0000 mg | Freq: Once | INTRAMUSCULAR | Status: AC
  Administered 2020-08-27: 40 mg via INTRAVENOUS
  Filled 2020-08-27: qty 4

## 2020-08-27 MED ORDER — DEXMEDETOMIDINE HCL IN NACL 400 MCG/100ML IV SOLN
0.4000 ug/kg/h | INTRAVENOUS | Status: DC
Start: 2020-08-27 — End: 2020-08-27
  Administered 2020-08-27: 0.4 ug/kg/h via INTRAVENOUS
  Filled 2020-08-27: qty 100

## 2020-08-27 MED ORDER — LACTATED RINGERS IV SOLN: INTRAVENOUS | Status: DC

## 2020-08-27 MED ORDER — MORPHINE SULFATE (PF) 2 MG/ML IV SOLN
2.0000 mg | INTRAVENOUS | Status: DC | PRN
  Administered 2020-08-27 (×4): 2 mg via INTRAVENOUS
  Administered 2020-08-28 (×5): 4 mg via INTRAVENOUS
  Filled 2020-08-27 (×2): qty 2
  Filled 2020-08-27: qty 1
  Filled 2020-08-27: qty 2
  Filled 2020-08-27 (×3): qty 1
  Filled 2020-08-27 (×2): qty 2

## 2020-08-27 MED ORDER — FUROSEMIDE 10 MG/ML IJ SOLN: 20.0000 mg | Freq: Once | INTRAMUSCULAR | Status: DC

## 2020-08-27 NOTE — Procedures (Signed)
Extubation Procedure Note  Patient Details:   Name: Shannon Hogan DOB: 07/20/2000 MRN: [id number]   Airway Documentation:    Vent end date: 08/27/20 Vent end time: 1254   Evaluation  O2 sats: stable throughout Complications: No apparent complications Patient did tolerate procedure well. Bilateral Breath Sounds: Rhonchi   Yes Placed on 4L/min  Incentive spirometer performed with fair effort  Newt Lukes 08/27/2020, 12:55 PM

## 2020-08-27 NOTE — Progress Notes (Signed)
Orthopaedic Trauma Progress Note  SUBJECTIVE: Patient intubated, does open eyes to voice.  Spontaneously moving bilateral upper and lower extremities. Probably extubation per trauma surgery today.   OBJECTIVE:  Vitals:   08/27/20 0600 08/27/20 0700  BP: 121/71 123/66  Pulse: (!) 109 (!) 109  Resp: 20 20  Temp:  98.78 F (37.1 C)  SpO2: 100% 100%    General: Intubated, resting in bed. NAD Right lower extremity: Dressings over the lateral hip and anterior pelvis clean, dry, intact.  Tolerates passive flexion of both the hip and the knee without any obvious signs of increased pain.  Unable to obtain reliable motor or sensory exam.  IMAGING: Stable post op imaging.   LABS:  Results for orders placed or performed during the hospital encounter of 08/24/20 (from the past 24 hour(s))  CBC     Status: Abnormal   Collection Time: 08/26/20  7:41 AM  Result Value Ref Range   WBC 12.1 (H) 4.0 - 10.5 K/uL   RBC 3.18 (L) 3.87 - 5.11 MIL/uL   Hemoglobin 8.9 (L) 12.0 - 15.0 g/dL   HCT 16.1 (L) 09.6 - 04.5 %   MCV 86.5 80.0 - 100.0 fL   MCH 28.0 26.0 - 34.0 pg   MCHC 32.4 30.0 - 36.0 g/dL   RDW 40.9 81.1 - 91.4 %   Platelets 92 (L) 150 - 400 K/uL   nRBC 0.0 0.0 - 0.2 %  Prepare RBC (crossmatch)     Status: None   Collection Time: 08/26/20 12:03 PM  Result Value Ref Range   Order Confirmation      ORDER PROCESSED BY BLOOD BANK BLOOD ALREADY AVAILABLE Performed at [facility], [address] [place], [place] [address]   CBC     Status: Abnormal   Collection Time: 08/26/20  3:09 PM  Result Value Ref Range   WBC 12.4 (H) 4.0 - 10.5 K/uL   RBC 3.06 (L) 3.87 - 5.11 MIL/uL   Hemoglobin 8.7 (L) 12.0 - 15.0 g/dL   HCT 62.1 (L) 30.8 - 65.7 %   MCV 85.6 80.0 - 100.0 fL   MCH 28.4 26.0 - 34.0 pg   MCHC 33.2 30.0 - 36.0 g/dL   RDW 84.6 96.2 - 95.2 %   Platelets 88 (L) 150 - 400 K/uL   nRBC 0.0 0.0 - 0.2 %  CBC     Status: Abnormal   Collection Time: 08/26/20  9:44 PM  Result Value  Ref Range   WBC 13.3 (H) 4.0 - 10.5 K/uL   RBC 2.77 (L) 3.87 - 5.11 MIL/uL   Hemoglobin 7.8 (L) 12.0 - 15.0 g/dL   HCT 84.1 (L) 32.4 - 40.1 %   MCV 87.0 80.0 - 100.0 fL   MCH 28.2 26.0 - 34.0 pg   MCHC 32.4 30.0 - 36.0 g/dL   RDW 02.7 25.3 - 66.4 %   Platelets 89 (L) 150 - 400 K/uL   nRBC 0.0 0.0 - 0.2 %    ASSESSMENT: Shannon Hogan is a 20 y.o. female, 1 Day Post-Op s/p PERCUTANEOUS FIXATION PELVIS  CV/Blood loss: Acute blood loss anemia, Hgb 7.8 on 08/26/2020.  CBC pending this AM.    PLAN: Weightbearing: TDWB RLE, WBAT LLE Incisional and dressing care: Plan to remove dressing 08/28/2020 and leave incisions open to air Orthopedic device(s): None  Pain management: Per trauma VTE prophylaxis: Lovenox once cleared from trauma surgery, SCDs ID:  Ancef 2gm post op Foley/Lines: Foley in place, continue IVFs per  trauma team Impediments to Fracture Healing: Vitamin D level pending, will start supplementation as indicated Dispo: Possible extubation today. Will need therapies once able.   Follow - up plan:  TBD  Contact information:  Truitt Merle MD, Ulyses Southward PA-C. After hours and holidays please check [email or web address] for group call information for [facility]   Rockne Dearinger A. Michaelyn Barter, PA-C [phone] (office) [email or web address]

## 2020-08-27 NOTE — Progress Notes (Signed)
Was approached by Selena Batten RRT about patient being put back on full support. I did not personally touch the ventilator so I do not know what happened. Unknown as to why ventilator switched back to full support. Will continue to monitor.

## 2020-08-27 NOTE — Progress Notes (Addendum)
Patient suddenly went bradycardic (40-50s) with an irregular heart rate. I turned off precedex and fentanyl gtt immediately. Patient R pupil initially nonreactive, is now brisk. Patient before would awaken to voice and move all extremities to pain and on command. Patient does not respond to voice or pain in extremities. Patient slightly opens L eye to deep sternal rub. Dr. Bedelia Person notified.     Since precedex has been off, HR is now NSR. Patient still mostly unresponsive. Lovick to come up to bedside asap.    1250 update: After Lovick assessed patient, patient symptoms likely due to an adverse reaction to precedex. We extubated at bedside. Patient following commands, 100% SpO2 on 3 L .    Sherral Hammers RN

## 2020-08-27 NOTE — Progress Notes (Signed)
RT note-Patient found back on sedation and full support.

## 2020-08-27 NOTE — Progress Notes (Signed)
Patient had one large brown/cloudy emesis episode. PRN zofran given and no vomiting since then. VSS. I will let the trauma MD know if she vomits again.    Sherral Hammers RN

## 2020-08-27 NOTE — Progress Notes (Signed)
Wasted 50 cc versed gtt with Roselyn Bering RN at this time.

## 2020-08-27 NOTE — Anesthesia Postprocedure Evaluation (Signed)
Anesthesia Post Note  Patient: Shannon Hogan  Procedure(s) Performed: Loyal Gambler     Patient location during evaluation: ICU Anesthesia Type: General Level of consciousness: patient remains intubated per anesthesia plan and sedated Pain management: pain level controlled Vital Signs Assessment: post-procedure vital signs reviewed and stable Respiratory status: patient remains intubated per anesthesia plan Cardiovascular status: blood pressure returned to baseline Postop Assessment: no apparent nausea or vomiting Anesthetic complications: no   No notable events documented.  Last Vitals:  Vitals:   08/27/20 0600 08/27/20 0700  BP: 121/71 123/66  Pulse: (!) 109 (!) 109  Resp: 20 20  Temp:  37.1 C  SpO2: 100% 100%    Last Pain:  Vitals:   08/26/20 1700  TempSrc: Bladder                 Shannon Hogan

## 2020-08-27 NOTE — Progress Notes (Signed)
Wasted 125 cc fentanyl gtt with Kathaleen Maser RN at this time.

## 2020-08-27 NOTE — Progress Notes (Signed)
I spoke to patient's mother on the phone at this time regarding [facility] visitation policy. I explained to mom that since patient is no longer intubated, sedated, and IS alert and oriented she can decide which 2 visitors she wants each day to come see her. Mom responded and said "I don't agree with that". I then explained to the mom the reasoning why [facility] still has the COVID visitation restriction and that the patient in particular needs to limit visitors because she is immunocompromised and at high risk of being put back on the ventilator if she gets COVID. Mom said "no one with covid is going to try to see her". I responded and said that sometimes people do not know that they have COVID and are symptomatic but they can unfortunately still spread the disease. Mom then said "Cleda has a lot of people trying to visit her I don't think it should be limited to 2 people". I explained again why the visitation policy is out of the nursing staff's control and that I cannot force the patient to pick what two visitors she wants each day because she is 20 years old and no longer has altered mental status. Mom verbalized understanding then hung up the phone.    Sherral Hammers RN
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
## 2020-08-28 ENCOUNTER — Inpatient Hospital Stay (HOSPITAL_COMMUNITY): Payer: Medicaid Other

## 2020-08-28 DIAGNOSIS — S37062A Major laceration of left kidney, initial encounter: Secondary | ICD-10-CM | POA: Diagnosis not present

## 2020-08-28 DIAGNOSIS — Z041 Encounter for examination and observation following transport accident: Secondary | ICD-10-CM | POA: Diagnosis not present

## 2020-08-28 DIAGNOSIS — S37812A Contusion of adrenal gland, initial encounter: Secondary | ICD-10-CM | POA: Diagnosis not present

## 2020-08-28 DIAGNOSIS — S36116A Major laceration of liver, initial encounter: Secondary | ICD-10-CM | POA: Diagnosis not present

## 2020-08-28 LAB — TYPE AND SCREEN
ABO/RH(D): A POS
Antibody Screen: NEGATIVE
Unit division: 0
Unit division: 0
Unit division: 0
Unit division: 0
Unit division: 0
Unit division: 0

## 2020-08-28 LAB — BASIC METABOLIC PANEL
Anion gap: 5 (ref 5–15)
Anion gap: 3 — ABNORMAL LOW (ref 5–15)
Anion gap: 6 (ref 5–15)
BUN: 5 mg/dL — ABNORMAL LOW (ref 6–20)
BUN: 5 mg/dL — ABNORMAL LOW (ref 6–20)
BUN: 5 mg/dL — ABNORMAL LOW (ref 6–20)
CO2: 26 mmol/L (ref 22–32)
CO2: 28 mmol/L (ref 22–32)
CO2: 28 mmol/L (ref 22–32)
Calcium: 7.6 mg/dL — ABNORMAL LOW (ref 8.9–10.3)
Calcium: 7.7 mg/dL — ABNORMAL LOW (ref 8.9–10.3)
Calcium: 8.1 mg/dL — ABNORMAL LOW (ref 8.9–10.3)
Chloride: 108 mmol/L (ref 98–111)
Chloride: 103 mmol/L (ref 98–111)
Chloride: 102 mmol/L (ref 98–111)
Creatinine, Ser: 0.68 mg/dL (ref 0.44–1.00)
Creatinine, Ser: 0.56 mg/dL (ref 0.44–1.00)
Creatinine, Ser: 0.48 mg/dL (ref 0.44–1.00)
GFR, Estimated: >60 mL/min (ref >60)
GFR, Estimated: >60 mL/min (ref >60)
GFR, Estimated: >60 mL/min (ref >60)
Glucose, Bld: 106 mg/dL — ABNORMAL HIGH (ref 70–99)
Glucose, Bld: 94 mg/dL (ref 70–99)
Glucose, Bld: 91 mg/dL (ref 70–99)
Potassium: 2.7 mmol/L — CL (ref 3.5–5.1)
Potassium: 6.3 mmol/L (ref 3.5–5.1)
Potassium: 2.8 mmol/L — ABNORMAL LOW (ref 3.5–5.1)
Sodium: 139 mmol/L (ref 135–145)
Sodium: 134 mmol/L — ABNORMAL LOW (ref 135–145)
Sodium: 136 mmol/L (ref 135–145)

## 2020-08-28 LAB — BPAM RBC
Blood Product Expiration Date: 202207122359
Blood Product Expiration Date: 202207132359
Blood Product Expiration Date: 202207262359
Blood Product Expiration Date: 202207262359
Blood Product Expiration Date: 202207282359
Blood Product Expiration Date: 202208032359
ISSUE DATE / TIME: 202207042239
ISSUE DATE / TIME: 202207042239
ISSUE DATE / TIME: 202207042251
ISSUE DATE / TIME: 202207042251
Unit Type and Rh: 6200
Unit Type and Rh: 6200
Unit Type and Rh: 9500
Unit Type and Rh: 9500
Unit Type and Rh: 9500
Unit Type and Rh: 9500

## 2020-08-28 LAB — HEMOGLOBIN AND HEMATOCRIT, BLOOD
HCT: 24.3 % — ABNORMAL LOW (ref 36.0–46.0)
Hemoglobin: 8.2 g/dL — ABNORMAL LOW (ref 12.0–15.0)

## 2020-08-28 LAB — CBC
HCT: 19.3 % — ABNORMAL LOW (ref 36.0–46.0)
Hemoglobin: 6.4 g/dL — CL (ref 12.0–15.0)
MCH: 28.2 pg (ref 26.0–34.0)
MCHC: 33.2 g/dL (ref 30.0–36.0)
MCV: 85 fL (ref 80.0–100.0)
Platelets: 90 10*3/uL — ABNORMAL LOW (ref 150–400)
RBC: 2.27 MIL/uL — ABNORMAL LOW (ref 3.87–5.11)
RDW: 14.7 % (ref 11.5–15.5)
WBC: 13.3 10*3/uL — ABNORMAL HIGH (ref 4.0–10.5)
nRBC: 0.2 % (ref 0.0–0.2)

## 2020-08-28 LAB — PREPARE RBC (CROSSMATCH)

## 2020-08-28 MED ORDER — WHITE PETROLATUM EX OINT
TOPICAL_OINTMENT | CUTANEOUS | Status: AC
  Filled 2020-08-28: qty 28.35

## 2020-08-28 MED ORDER — MORPHINE SULFATE (PF) 2 MG/ML IV SOLN
2.0000 mg | INTRAVENOUS | Status: DC | PRN
Start: 2020-08-28 — End: 2020-09-03
  Administered 2020-08-28 – 2020-08-29 (×2): 4 mg via INTRAVENOUS
  Administered 2020-09-01: 2 mg via INTRAVENOUS
  Administered 2020-09-01: 4 mg via INTRAVENOUS
  Administered 2020-09-02: 2 mg via INTRAVENOUS
  Filled 2020-08-28 (×2): qty 2
  Filled 2020-08-28 (×3): qty 1
  Filled 2020-08-28 (×2): qty 2

## 2020-08-28 MED ORDER — POTASSIUM CHLORIDE 10 MEQ/100ML IV SOLN
10.0000 meq | INTRAVENOUS | Status: AC
  Administered 2020-08-28 (×4): 10 meq via INTRAVENOUS
  Filled 2020-08-28 (×4): qty 100

## 2020-08-28 MED ORDER — ORAL CARE MOUTH RINSE
15.0000 mL | Freq: Two times a day (BID) | OROMUCOSAL | Status: DC
  Administered 2020-08-28 – 2020-08-29 (×3): 15 mL via OROMUCOSAL

## 2020-08-28 MED ORDER — HYDROMORPHONE HCL 1 MG/ML IJ SOLN: 1.0000 mg | INTRAMUSCULAR | Status: DC | PRN

## 2020-08-28 MED ORDER — METHOCARBAMOL 500 MG PO TABS: 750.0000 mg | ORAL_TABLET | Freq: Three times a day (TID) | ORAL | Status: DC

## 2020-08-28 MED ORDER — POTASSIUM CHLORIDE 20 MEQ PO PACK
40.0000 meq | PACK | ORAL | Status: DC
  Administered 2020-08-28: 40 meq via ORAL
  Filled 2020-08-28: qty 2

## 2020-08-28 MED ORDER — METHOCARBAMOL 750 MG PO TABS
750.0000 mg | ORAL_TABLET | Freq: Three times a day (TID) | ORAL | Status: DC
  Administered 2020-08-28 – 2020-09-03 (×16): 750 mg via ORAL
  Filled 2020-08-28 (×13): qty 1
  Filled 2020-08-28: qty 2
  Filled 2020-08-28 (×2): qty 1

## 2020-08-28 MED ORDER — ACETAMINOPHEN 500 MG PO TABS
1000.0000 mg | ORAL_TABLET | Freq: Four times a day (QID) | ORAL | Status: DC
  Administered 2020-08-28 – 2020-09-03 (×21): 1000 mg via ORAL
  Filled 2020-08-28 (×23): qty 2

## 2020-08-28 MED ORDER — IOHEXOL 300 MG/ML  SOLN
75.0000 mL | Freq: Once | INTRAMUSCULAR | Status: AC | PRN
  Administered 2020-08-28: 75 mL via INTRAVENOUS

## 2020-08-28 MED ORDER — ALPRAZOLAM 0.5 MG PO TABS: 0.2500 mg | ORAL_TABLET | Freq: Three times a day (TID) | ORAL | Status: DC | PRN

## 2020-08-28 MED ORDER — OXYCODONE HCL 5 MG PO TABS
5.0000 mg | ORAL_TABLET | ORAL | Status: DC | PRN
  Administered 2020-08-29 – 2020-09-03 (×18): 10 mg via ORAL
  Filled 2020-08-28 (×15): qty 2
  Filled 2020-08-28: qty 1
  Filled 2020-08-28 (×2): qty 2
  Filled 2020-08-28: qty 1
  Filled 2020-08-28: qty 2

## 2020-08-28 MED ORDER — DOCUSATE SODIUM 100 MG PO CAPS
100.0000 mg | ORAL_CAPSULE | Freq: Two times a day (BID) | ORAL | Status: DC
  Administered 2020-08-28 – 2020-08-31 (×6): 100 mg via ORAL
  Filled 2020-08-28 (×6): qty 1

## 2020-08-28 MED ORDER — FUROSEMIDE 10 MG/ML IJ SOLN
20.0000 mg | Freq: Once | INTRAMUSCULAR | Status: AC
  Administered 2020-08-28: 20 mg via INTRAVENOUS
  Filled 2020-08-28: qty 2

## 2020-08-28 MED ORDER — POTASSIUM CHLORIDE 20 MEQ PO PACK
40.0000 meq | PACK | ORAL | Status: AC
  Administered 2020-08-29 (×3): 40 meq via ORAL
  Filled 2020-08-28 (×3): qty 2

## 2020-08-28 MED ORDER — SODIUM CHLORIDE 0.9% IV SOLUTION: Freq: Once | INTRAVENOUS | Status: DC

## 2020-08-28 MED ORDER — HYDROMORPHONE HCL 1 MG/ML IJ SOLN
1.0000 mg | INTRAMUSCULAR | Status: DC | PRN
  Administered 2020-08-28 (×2): 1 mg via INTRAVENOUS
  Filled 2020-08-28 (×2): qty 1

## 2020-08-28 MED ORDER — CALCIUM GLUCONATE-NACL 1-0.675 GM/50ML-% IV SOLN
1.0000 g | Freq: Once | INTRAVENOUS | Status: AC
  Administered 2020-08-28: 1000 mg via INTRAVENOUS
  Filled 2020-08-28: qty 50

## 2020-08-28 MED ORDER — POLYETHYLENE GLYCOL 3350 17 G PO PACK
17.0000 g | PACK | Freq: Every day | ORAL | Status: DC
  Filled 2020-08-28: qty 1
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
## 2020-08-28 NOTE — Progress Notes (Signed)
Orthopaedic Trauma Progress Note  SUBJECTIVE: Yelling out in pain. Once I entered the room and spoke with her she stopped yelling and calmed down, flee asleep. Notes some belly pain but is not particularly tender with palpation of her abdomen. Wants to know where her boyfriend is.   OBJECTIVE:  Vitals:   08/28/20 0900 08/28/20 1000  BP: 127/78 119/76  Pulse: 95 100  Resp: 20 18  Temp: 99.32 F (37.4 C)   SpO2: 93% 100%    General: Laying in bed, Right lower extremity: Dressings over the lateral hip and anterior pelvis removed, incisions are clean, dry, intact.  Tolerates hip and knee motion without increased pain. Moving the leg free in bed. Endorses sensation throughout extremity. +DP pulse  IMAGING: Stable post op imaging.   LABS:  Results for orders placed or performed during the hospital encounter of 08/24/20 (from the past 24 hour(s))  CBC     Status: Abnormal   Collection Time: 08/28/20  6:12 AM  Result Value Ref Range   WBC 13.3 (H) 4.0 - 10.5 K/uL   RBC 2.27 (L) 3.87 - 5.11 MIL/uL   Hemoglobin 6.4 (LL) 12.0 - 15.0 g/dL   HCT 79.0 (L) 24.0 - 97.3 %   MCV 85.0 80.0 - 100.0 fL   MCH 28.2 26.0 - 34.0 pg   MCHC 33.2 30.0 - 36.0 g/dL   RDW 53.2 99.2 - 42.6 %   Platelets 90 (L) 150 - 400 K/uL   nRBC 0.2 0.0 - 0.2 %  Basic metabolic panel     Status: Abnormal   Collection Time: 08/28/20  6:12 AM  Result Value Ref Range   Sodium 139 135 - 145 mmol/L   Potassium 2.7 (LL) 3.5 - 5.1 mmol/L   Chloride 108 98 - 111 mmol/L   CO2 26 22 - 32 mmol/L   Glucose, Bld 106 (H) 70 - 99 mg/dL   BUN 5 (L) 6 - 20 mg/dL   Creatinine, Ser 8.34 0.44 - 1.00 mg/dL   Calcium 7.6 (L) 8.9 - 10.3 mg/dL   GFR, Estimated >19 >62 mL/min   Anion gap 5 5 - 15  Prepare RBC (crossmatch)     Status: None   Collection Time: 08/28/20  8:27 AM  Result Value Ref Range   Order Confirmation      ORDER PROCESSED BY BLOOD BANK Performed at [facility], [address], [place], [place] [address]    Type and screen [facility]     Status: None (Preliminary result)   Collection Time: 08/28/20  8:27 AM  Result Value Ref Range   ABO/RH(D) A POS    Antibody Screen NEG    Sample Expiration 08/31/2020,2359    Unit Number [id number]    Blood Component Type RED CELLS,LR    Unit division 00    Status of Unit ISSUED    Transfusion Status OK TO TRANSFUSE    Crossmatch Result      Compatible Performed at [facility], [address] [place], [place] [address]     ASSESSMENT: Shannon Hogan is a 20 y.o. female, 2 Days Post-Op s/p PERCUTANEOUS FIXATION PELVIS  CV/Blood loss: Acute blood loss anemia, Hgb 6.4 this morning. 1 unit PRBCs has been ordered  PLAN: Weightbearing: TDWB RLE, WBAT LLE ROM: Unrestricted hip motion as tolerated Incisional and dressing care: Ok to leave incisions open to air Orthopedic device(s): None  Pain management: Per trauma VTE prophylaxis: Lovenox once Hgb stabilizes and  cleared from trauma surgery, SCDs ID:  Ancef 2gm post op completed Foley/Lines: Foley in place, continue IVFs per trauma team Impediments to Fracture Healing: Vitamin D level 12, will start supplementation. This should be continued at discharge  Dispo: Continue to monitor CBC. PT/OT as tolerated.   Follow - up plan: Will continue to follow while in hospital, plan for outpatient follow-up 2 weeks after d/c  Contact information:  Truitt Merle MD, Ulyses Southward PA-C. After hours and holidays please check [email or web address] for group call information for [facility]   Dian Minahan A. Michaelyn Barter, PA-C [phone] (office) [email or web address]

## 2020-08-29 ENCOUNTER — Inpatient Hospital Stay (HOSPITAL_COMMUNITY): Payer: Medicaid Other

## 2020-08-29 DIAGNOSIS — S37061A Major laceration of right kidney, initial encounter: Secondary | ICD-10-CM | POA: Diagnosis not present

## 2020-08-29 LAB — BPAM RBC
Blood Product Expiration Date: 202208022359
ISSUE DATE / TIME: 202207081102
Unit Type and Rh: 6200

## 2020-08-29 LAB — CBC
HCT: 22.4 % — ABNORMAL LOW (ref 36.0–46.0)
Hemoglobin: 7.5 g/dL — ABNORMAL LOW (ref 12.0–15.0)
MCH: 28.6 pg (ref 26.0–34.0)
MCHC: 33.5 g/dL (ref 30.0–36.0)
MCV: 85.5 fL (ref 80.0–100.0)
Platelets: 109 10*3/uL — ABNORMAL LOW (ref 150–400)
RBC: 2.62 MIL/uL — ABNORMAL LOW (ref 3.87–5.11)
RDW: 14.4 % (ref 11.5–15.5)
WBC: 11.8 10*3/uL — ABNORMAL HIGH (ref 4.0–10.5)
nRBC: 0.5 % — ABNORMAL HIGH (ref 0.0–0.2)

## 2020-08-29 LAB — BASIC METABOLIC PANEL
Anion gap: 6 (ref 5–15)
BUN: 5 mg/dL — ABNORMAL LOW (ref 6–20)
CO2: 28 mmol/L (ref 22–32)
Calcium: 8 mg/dL — ABNORMAL LOW (ref 8.9–10.3)
Chloride: 105 mmol/L (ref 98–111)
Creatinine, Ser: 0.52 mg/dL (ref 0.44–1.00)
GFR, Estimated: >60 mL/min (ref >60)
Glucose, Bld: 107 mg/dL — ABNORMAL HIGH (ref 70–99)
Potassium: 3.4 mmol/L — ABNORMAL LOW (ref 3.5–5.1)
Sodium: 139 mmol/L (ref 135–145)

## 2020-08-29 LAB — TYPE AND SCREEN
ABO/RH(D): A POS
Antibody Screen: NEGATIVE
Unit division: 0

## 2020-08-29 MED ORDER — IOHEXOL 300 MG/ML  SOLN
50.0000 mL | Freq: Once | INTRAMUSCULAR | Status: AC | PRN
  Administered 2020-08-29: 50 mL

## 2020-08-29 MED ORDER — PHENOL 1.4 % MT LIQD
1.0000 | OROMUCOSAL | Status: DC | PRN
  Administered 2020-08-29: 1 via OROMUCOSAL
  Filled 2020-08-29 (×2): qty 177
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
## 2020-08-30 ENCOUNTER — Inpatient Hospital Stay (HOSPITAL_COMMUNITY): Payer: Medicaid Other

## 2020-08-30 DIAGNOSIS — M542 Cervicalgia: Secondary | ICD-10-CM | POA: Diagnosis not present

## 2020-08-30 LAB — BASIC METABOLIC PANEL
Anion gap: 7 (ref 5–15)
BUN: <5 mg/dL — ABNORMAL LOW (ref 6–20)
CO2: 28 mmol/L (ref 22–32)
Calcium: 8.2 mg/dL — ABNORMAL LOW (ref 8.9–10.3)
Chloride: 102 mmol/L (ref 98–111)
Creatinine, Ser: 0.51 mg/dL (ref 0.44–1.00)
GFR, Estimated: >60 mL/min (ref >60)
Glucose, Bld: 94 mg/dL (ref 70–99)
Potassium: 3.3 mmol/L — ABNORMAL LOW (ref 3.5–5.1)
Sodium: 137 mmol/L (ref 135–145)

## 2020-08-30 LAB — CBC
HCT: 22.9 % — ABNORMAL LOW (ref 36.0–46.0)
Hemoglobin: 7.6 g/dL — ABNORMAL LOW (ref 12.0–15.0)
MCH: 28.8 pg (ref 26.0–34.0)
MCHC: 33.2 g/dL (ref 30.0–36.0)
MCV: 86.7 fL (ref 80.0–100.0)
Platelets: 131 10*3/uL — ABNORMAL LOW (ref 150–400)
RBC: 2.64 MIL/uL — ABNORMAL LOW (ref 3.87–5.11)
RDW: 14.9 % (ref 11.5–15.5)
WBC: 11.9 10*3/uL — ABNORMAL HIGH (ref 4.0–10.5)
nRBC: 0.7 % — ABNORMAL HIGH (ref 0.0–0.2)

## 2020-08-30 MED ORDER — LORAZEPAM 2 MG/ML IJ SOLN
0.5000 mg | Freq: Once | INTRAMUSCULAR | Status: AC
  Administered 2020-08-30: 0.5 mg via INTRAVENOUS
  Filled 2020-08-30: qty 1

## 2020-08-30 MED ORDER — POTASSIUM CHLORIDE 20 MEQ PO PACK
40.0000 meq | PACK | Freq: Once | ORAL | Status: AC
  Administered 2020-08-30: 40 meq via ORAL
  Filled 2020-08-30: qty 2

## 2020-08-30 NOTE — Progress Notes (Signed)
Inpatient Rehab Admissions Coordinator Note:   Per therapy recommendations, pt was screened for CIR candidacy by Kennetta Pavlovic, MS CCC-SLP. At this time, Pt. Appears to have functional decline and is a potential  candidate for CIR. Will place order for rehab consult per protocol.  Please contact me with questions.   Sansa Alkema, MS, CCC-SLP Rehab Admissions Coordinator  [phone] (celll) [phone] (office)  

## 2020-08-30 NOTE — Progress Notes (Signed)
21:21 2.5 second pause; 21:27 2.02 second pause; stripped saved
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
## 2020-08-30 NOTE — Progress Notes (Signed)
Pt called out saying "I feel like I can't breathe." Pt's 02 sats and RR are unchanged. Pt has continued constant throat clearing and hacking throughout the day. At times, pt has put her fingers in the back of her throat to "get it out" and has also been putting the yankauer down her throat. This RN took yankauer from pt as she has not been needing it for actual suction purposes. Pt also educated not to stick her fingers down her throat. Looked in the back of the pt's throat for any foreign bodies, and none were seen, but the uvula appears to be swollen. Text-paged Dr. Janee Morn and heard back that may place orders for guaifenesin if needed. Does not appear to be congestion, so no new orders placed at this time.   Robina Ade, RN
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
## 2020-08-31 LAB — BASIC METABOLIC PANEL
Anion gap: 7 (ref 5–15)
BUN: 7 mg/dL (ref 6–20)
CO2: 28 mmol/L (ref 22–32)
Calcium: 8.3 mg/dL — ABNORMAL LOW (ref 8.9–10.3)
Chloride: 103 mmol/L (ref 98–111)
Creatinine, Ser: 0.58 mg/dL (ref 0.44–1.00)
GFR, Estimated: >60 mL/min (ref >60)
Glucose, Bld: 87 mg/dL (ref 70–99)
Potassium: 3.2 mmol/L — ABNORMAL LOW (ref 3.5–5.1)
Sodium: 138 mmol/L (ref 135–145)

## 2020-08-31 LAB — CBC
HCT: 25.1 % — ABNORMAL LOW (ref 36.0–46.0)
Hemoglobin: 8.2 g/dL — ABNORMAL LOW (ref 12.0–15.0)
MCH: 28.6 pg (ref 26.0–34.0)
MCHC: 32.7 g/dL (ref 30.0–36.0)
MCV: 87.5 fL (ref 80.0–100.0)
Platelets: 152 10*3/uL (ref 150–400)
RBC: 2.87 MIL/uL — ABNORMAL LOW (ref 3.87–5.11)
RDW: 15.9 % — ABNORMAL HIGH (ref 11.5–15.5)
WBC: 11.8 10*3/uL — ABNORMAL HIGH (ref 4.0–10.5)
nRBC: 1 % — ABNORMAL HIGH (ref 0.0–0.2)

## 2020-08-31 MED ORDER — LORAZEPAM 0.5 MG PO TABS
0.5000 mg | ORAL_TABLET | Freq: Once | ORAL | Status: AC
  Administered 2020-08-31: 0.5 mg via ORAL
  Filled 2020-08-31: qty 1

## 2020-08-31 MED ORDER — POTASSIUM CHLORIDE 20 MEQ PO PACK
40.0000 meq | PACK | Freq: Two times a day (BID) | ORAL | Status: DC
  Filled 2020-08-31: qty 2

## 2020-08-31 MED ORDER — POTASSIUM CHLORIDE CRYS ER 20 MEQ PO TBCR
40.0000 meq | EXTENDED_RELEASE_TABLET | Freq: Two times a day (BID) | ORAL | Status: AC
  Administered 2020-08-31 (×2): 40 meq via ORAL
  Filled 2020-08-31 (×2): qty 2

## 2020-08-31 MED ORDER — LEVETIRACETAM 500 MG PO TABS
1000.0000 mg | ORAL_TABLET | Freq: Two times a day (BID) | ORAL | Status: AC
  Administered 2020-08-31 – 2020-09-01 (×3): 1000 mg via ORAL
  Filled 2020-08-31 (×3): qty 2
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
## 2020-08-31 NOTE — Progress Notes (Signed)
Inpatient Rehabilitation Admissions Coordinator   Inpatient rehab consult received. I met with patient at bedside . We discussed goals and expectations of a possible [facility] admit. She prefers Cir admit if possible . I will begin insurance approval with [place] medicaid [facility] for possible admit pending approval and bed availability,   , RN, MSN Rehab Admissions Coordinator [phone] 08/31/2020 11:18 AM  

## 2020-08-31 NOTE — Progress Notes (Signed)
Spoke to pharmacist regarding changing pt's potassium from solution to tablet, and changing keppra from IV to oral, per Barnetta Chapel, PA.   Robina Ade, RN

## 2020-09-01 DIAGNOSIS — S2242XA Multiple fractures of ribs, left side, initial encounter for closed fracture: Secondary | ICD-10-CM | POA: Diagnosis not present

## 2020-09-01 DIAGNOSIS — S36115A Moderate laceration of liver, initial encounter: Secondary | ICD-10-CM | POA: Diagnosis not present

## 2020-09-01 DIAGNOSIS — J9621 Acute and chronic respiratory failure with hypoxia: Secondary | ICD-10-CM | POA: Diagnosis not present

## 2020-09-01 DIAGNOSIS — D62 Acute posthemorrhagic anemia: Secondary | ICD-10-CM | POA: Diagnosis not present

## 2020-09-01 DIAGNOSIS — S37001A Unspecified injury of right kidney, initial encounter: Secondary | ICD-10-CM | POA: Diagnosis not present

## 2020-09-01 DIAGNOSIS — Z9911 Dependence on respirator [ventilator] status: Secondary | ICD-10-CM | POA: Diagnosis not present

## 2020-09-01 DIAGNOSIS — E876 Hypokalemia: Secondary | ICD-10-CM | POA: Diagnosis not present

## 2020-09-01 LAB — BASIC METABOLIC PANEL
Anion gap: 9 (ref 5–15)
BUN: 7 mg/dL (ref 6–20)
CO2: 23 mmol/L (ref 22–32)
Calcium: 8.3 mg/dL — ABNORMAL LOW (ref 8.9–10.3)
Chloride: 105 mmol/L (ref 98–111)
Creatinine, Ser: 0.62 mg/dL (ref 0.44–1.00)
GFR, Estimated: >60 mL/min (ref >60)
Glucose, Bld: 86 mg/dL (ref 70–99)
Potassium: 3.3 mmol/L — ABNORMAL LOW (ref 3.5–5.1)
Sodium: 137 mmol/L (ref 135–145)

## 2020-09-01 LAB — CBC
HCT: 28.7 % — ABNORMAL LOW (ref 36.0–46.0)
Hemoglobin: 9.3 g/dL — ABNORMAL LOW (ref 12.0–15.0)
MCH: 28 pg (ref 26.0–34.0)
MCHC: 32.4 g/dL (ref 30.0–36.0)
MCV: 86.4 fL (ref 80.0–100.0)
Platelets: 192 10*3/uL (ref 150–400)
RBC: 3.32 MIL/uL — ABNORMAL LOW (ref 3.87–5.11)
RDW: 17.3 % — ABNORMAL HIGH (ref 11.5–15.5)
WBC: 12.7 10*3/uL — ABNORMAL HIGH (ref 4.0–10.5)
nRBC: 0.5 % — ABNORMAL HIGH (ref 0.0–0.2)

## 2020-09-01 MED ORDER — POTASSIUM CHLORIDE CRYS ER 20 MEQ PO TBCR
40.0000 meq | EXTENDED_RELEASE_TABLET | Freq: Two times a day (BID) | ORAL | Status: AC
  Administered 2020-09-01: 40 meq via ORAL
  Filled 2020-09-01 (×2): qty 2

## 2020-09-01 MED ORDER — POLYETHYLENE GLYCOL 3350 17 G PO PACK: 17.0000 g | PACK | Freq: Every day | ORAL | Status: DC | PRN
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
## 2020-09-01 NOTE — Progress Notes (Signed)
Inpatient Rehabilitation Admissions Coordinator   I met with patient at bedside with her cousin. She is requesting to see if [facility] may have bed available so she can be closer to her son at [facility] if possible. I had explained that we may not have bed available pending insurance approval. I will discuss with TOC. I encouraged her to continue to mobilize as much as able with therapy in nursing.  Danne Baxter, RN, MSN Rehab Admissions Coordinator [phone] 09/01/2020 11:35 AM
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
## 2020-09-02 DIAGNOSIS — S2242XA Multiple fractures of ribs, left side, initial encounter for closed fracture: Secondary | ICD-10-CM | POA: Diagnosis not present

## 2020-09-02 DIAGNOSIS — S270XXA Traumatic pneumothorax, initial encounter: Secondary | ICD-10-CM | POA: Diagnosis not present

## 2020-09-02 DIAGNOSIS — S3210XA Unspecified fracture of sacrum, initial encounter for closed fracture: Secondary | ICD-10-CM | POA: Diagnosis not present

## 2020-09-02 DIAGNOSIS — S36115A Moderate laceration of liver, initial encounter: Secondary | ICD-10-CM | POA: Diagnosis not present

## 2020-09-02 DIAGNOSIS — Z20822 Contact with and (suspected) exposure to covid-19: Secondary | ICD-10-CM | POA: Diagnosis not present

## 2020-09-02 DIAGNOSIS — S37819A Unspecified injury of adrenal gland, initial encounter: Secondary | ICD-10-CM | POA: Diagnosis not present

## 2020-09-02 DIAGNOSIS — S37001A Unspecified injury of right kidney, initial encounter: Secondary | ICD-10-CM | POA: Diagnosis not present

## 2020-09-02 DIAGNOSIS — R578 Other shock: Secondary | ICD-10-CM | POA: Diagnosis not present

## 2020-09-02 DIAGNOSIS — S32511A Fracture of superior rim of right pubis, initial encounter for closed fracture: Secondary | ICD-10-CM | POA: Diagnosis not present

## 2020-09-02 DIAGNOSIS — J9621 Acute and chronic respiratory failure with hypoxia: Secondary | ICD-10-CM | POA: Diagnosis not present

## 2020-09-02 DIAGNOSIS — D62 Acute posthemorrhagic anemia: Secondary | ICD-10-CM | POA: Diagnosis not present

## 2020-09-02 DIAGNOSIS — S37061A Major laceration of right kidney, initial encounter: Secondary | ICD-10-CM | POA: Diagnosis not present

## 2020-09-02 DIAGNOSIS — J9601 Acute respiratory failure with hypoxia: Secondary | ICD-10-CM | POA: Diagnosis not present

## 2020-09-02 DIAGNOSIS — S36119A Unspecified injury of liver, initial encounter: Secondary | ICD-10-CM | POA: Diagnosis not present

## 2020-09-02 DIAGNOSIS — E876 Hypokalemia: Secondary | ICD-10-CM | POA: Diagnosis not present

## 2020-09-02 DIAGNOSIS — S2243XA Multiple fractures of ribs, bilateral, initial encounter for closed fracture: Secondary | ICD-10-CM | POA: Diagnosis not present

## 2020-09-02 DIAGNOSIS — Z9911 Dependence on respirator [ventilator] status: Secondary | ICD-10-CM | POA: Diagnosis not present

## 2020-09-02 DIAGNOSIS — S3216XA Type 3 fracture of sacrum, initial encounter for closed fracture: Secondary | ICD-10-CM | POA: Diagnosis not present

## 2020-09-02 LAB — BASIC METABOLIC PANEL
Anion gap: 7 (ref 5–15)
BUN: 9 mg/dL (ref 6–20)
CO2: 25 mmol/L (ref 22–32)
Calcium: 8.8 mg/dL — ABNORMAL LOW (ref 8.9–10.3)
Chloride: 106 mmol/L (ref 98–111)
Creatinine, Ser: 0.57 mg/dL (ref 0.44–1.00)
GFR, Estimated: >60 mL/min (ref >60)
Glucose, Bld: 98 mg/dL (ref 70–99)
Potassium: 3.4 mmol/L — ABNORMAL LOW (ref 3.5–5.1)
Sodium: 138 mmol/L (ref 135–145)

## 2020-09-02 NOTE — Progress Notes (Signed)
Inpatient Rehabilitation Admissions Coordinator   I met with patient at bedside and spoke with Physical Therapy. Patient ambulated better today and has progressed to be able to discharge Home and not need Cir admit. She plans to go to her Nana's home with level entry, has a wheelchair and is requesting a RW. I have alerted acute team and TOC. We will sign off at this time.  Danne Baxter, RN, MSN Rehab Admissions Coordinator [phone] 09/02/2020 11:40 AM
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
## 2020-09-02 NOTE — TOC Progression Note (Signed)
Transition of Care [facility]) - Progression Note    Patient Details  Name: Ryla Cauthon MRN: [id number] Date of Birth: 16-Oct-2000  [facility] CM/SW Contact  Glennon Mac, RN Phone Number: 09/02/2020, 3:05 PM  Clinical Narrative: Patient has progressed well with therapies, and is now supervision with ambulation; ambulated 150 feet today with PT.  Patient desires to go home with multiple family members to assist with care.  She states that her child is making good progress at [facility], and is off the ventilator.  Patient prefers outpatient therapy at discharge; will make referrals to [facility] for follow-up.  Patient states that she has all needed equipment at home provided by family members except  rolling walker.  Referral to [employer] for rolling walker, to be delivered to bedside prior to discharge.    Expected Discharge Plan: OP Rehab Barriers to Discharge: Barriers Resolved  Expected Discharge Plan and Services Expected Discharge Plan: OP Rehab   Discharge Planning Services: CM Consult   Living arrangements for the past 2 months: Single Family Home                 DME Arranged: Walker rolling DME Agency: [employer] Date DME Agency Contacted: 09/02/20 Time DME Agency Contacted: 28 Representative spoke with at DME Agency: Velna Hatchet             Social Determinants of Health (SDOH) Interventions    Readmission Risk Interventions No flowsheet data found.  Quintella Baton, RN, BSN  Trauma/Neuro ICU Case Manager [phone]
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
## 2020-09-03 ENCOUNTER — Encounter: Payer: Self-pay | Admitting: Obstetrics and Gynecology

## 2020-09-03 DIAGNOSIS — S3210XA Unspecified fracture of sacrum, initial encounter for closed fracture: Secondary | ICD-10-CM | POA: Diagnosis not present

## 2020-09-03 MED ORDER — OXYCODONE HCL 5 MG PO TABS: 5.0000 mg | ORAL_TABLET | ORAL | 0 refills | Status: DC | PRN

## 2020-09-03 MED ORDER — METHOCARBAMOL 750 MG PO TABS: 750.0000 mg | ORAL_TABLET | Freq: Three times a day (TID) | ORAL | 0 refills | Status: DC | PRN

## 2020-09-03 MED ORDER — ACETAMINOPHEN 500 MG PO TABS: 1000.0000 mg | ORAL_TABLET | Freq: Four times a day (QID) | ORAL | 0 refills | Status: DC | PRN

## 2020-09-03 NOTE — Progress Notes (Signed)
Pt discharged from facility. PIV removed. Pt received home equipment yesterday, and additionally a tub bench was delivered to the room today. All room belongings packed, including pt's home walker. Pt escorted to private vehicle in wheelchair with all belongings.   Robina Ade, RN

## 2020-09-03 NOTE — Discharge Instructions (Addendum)
Touch down weightbearing to right lower extremity

## 2020-09-03 NOTE — TOC Transition Note (Signed)
Transition of Care [facility]) - CM/SW Discharge Note   Patient Details  Name: Shannon Hogan MRN: [id number] Date of Birth: 2000/06/07  Transition of Care [facility]) CM/SW Contact:  Glennon Mac, RN Phone Number: 09/03/2020, 12:00 PM   Clinical Narrative:   Pt medically stable for discharge home today with family to provide 24h care.  OT recommending tub bench; notified [employer] of need for additional DME.  Will add OP ST cognitive evaluation per recommendation.      Final next level of care: OP Rehab Barriers to Discharge: Barriers Resolved   Patient Goals and CMS Choice Patient states their goals for this hospitalization and ongoing recovery are:: to see my baby                            Discharge Plan and Services   Discharge Planning Services: CM Consult            DME Arranged: Tub bench, Walker rolling DME Agency: [employer] Date DME Agency Contacted: 09/02/20 Time DME Agency Contacted: 76 Representative spoke with at DME Agency: Velna Hatchet            Social Determinants of Health (SDOH) Interventions     Readmission Risk Interventions No flowsheet data found.  Quintella Baton, RN, BSN  Trauma/Neuro ICU Case Manager [phone]
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
## 2020-09-04 ENCOUNTER — Telehealth: Payer: Self-pay

## 2020-09-04 NOTE — Telephone Encounter (Signed)
Transition Care Management Unsuccessful Follow-up Telephone Call  Date of discharge and from where:  09/03/2020-[facility] ED   Attempts:  1st Attempt  Reason for unsuccessful TCM follow-up call:  Left voice message

## 2020-09-04 NOTE — Telephone Encounter (Signed)
Transition Care Management Follow-up Telephone Call Date of discharge and from where: 09/03/2020-[facility] ED How have you been since you were released from the hospital? Patient is doing fine  Any questions or concerns? No  Items Reviewed: Did the pt receive and understand the discharge instructions provided? Yes  Medications obtained and verified? Yes  Other? No  Any new allergies since your discharge? No  Dietary orders reviewed? N/A Do you have support at home? Yes   Home Care and Equipment/Supplies: Were home health services ordered? not applicable If so, what is the name of the agency? N/A  Has the agency set up a time to come to the patient's home? not applicable Were any new equipment or medical supplies ordered?  No What is the name of the medical supply agency? N/A Were you able to get the supplies/equipment? not applicable Do you have any questions related to the use of the equipment or supplies? No  Functional Questionnaire: (I = Independent and D = Dependent) ADLs: I  Bathing/Dressing- I  Meal Prep- I  Eating- I  Maintaining continence- I  Transferring/Ambulation- I  Managing Meds- I  Follow up appointments reviewed:  PCP Hospital f/u appt confirmed? No  Sees Norva Riffle as PCP but no follow up scheduled. Specialist Hospital f/u appt confirmed? No  pt will call to schedule her follow up appointments. Are transportation arrangements needed? No  If their condition worsens, is the pt aware to call PCP or go to the Emergency Dept.? Yes Was the patient provided with contact information for the PCP's office or ED? Yes Was to pt encouraged to call back with questions or concerns? Yes

## 2020-09-12 ENCOUNTER — Emergency Department (HOSPITAL_BASED_OUTPATIENT_CLINIC_OR_DEPARTMENT_OTHER)
Admission: EM | Admit: 2020-09-12 | Discharge: 2020-09-12 | Disposition: A | Discharge status: Home / Self Care | Payer: Medicaid Other | Attending: Emergency Medicine | Admitting: Emergency Medicine

## 2020-09-12 ENCOUNTER — Other Ambulatory Visit: Payer: Self-pay

## 2020-09-12 ENCOUNTER — Emergency Department (HOSPITAL_BASED_OUTPATIENT_CLINIC_OR_DEPARTMENT_OTHER): Payer: Medicaid Other

## 2020-09-12 ENCOUNTER — Encounter (HOSPITAL_BASED_OUTPATIENT_CLINIC_OR_DEPARTMENT_OTHER): Payer: Self-pay | Admitting: Emergency Medicine

## 2020-09-12 DIAGNOSIS — Y848 Other medical procedures as the cause of abnormal reaction of the patient, or of later complication, without mention of misadventure at the time of the procedure: Secondary | ICD-10-CM | POA: Insufficient documentation

## 2020-09-12 DIAGNOSIS — S3210XA Unspecified fracture of sacrum, initial encounter for closed fracture: Secondary | ICD-10-CM | POA: Diagnosis not present

## 2020-09-12 DIAGNOSIS — T8131XA Disruption of external operation (surgical) wound, not elsewhere classified, initial encounter: Secondary | ICD-10-CM | POA: Insufficient documentation

## 2020-09-12 DIAGNOSIS — L089 Local infection of the skin and subcutaneous tissue, unspecified: Secondary | ICD-10-CM

## 2020-09-12 DIAGNOSIS — S32511A Fracture of superior rim of right pubis, initial encounter for closed fracture: Secondary | ICD-10-CM | POA: Diagnosis not present

## 2020-09-12 DIAGNOSIS — Z9889 Other specified postprocedural states: Secondary | ICD-10-CM | POA: Diagnosis not present

## 2020-09-12 MED ORDER — MUPIROCIN CALCIUM 2 % EX CREA: 1.0000 "application " | TOPICAL_CREAM | Freq: Two times a day (BID) | CUTANEOUS | 0 refills | Status: DC

## 2020-09-12 MED ORDER — MUPIROCIN CALCIUM 2 % EX CREA
TOPICAL_CREAM | Freq: Once | CUTANEOUS | Status: DC
  Filled 2020-09-12: qty 15

## 2020-09-12 MED ORDER — SULFAMETHOXAZOLE-TRIMETHOPRIM 800-160 MG PO TABS: 1.0000 | ORAL_TABLET | Freq: Two times a day (BID) | ORAL | 0 refills | Status: DC

## 2020-09-12 MED ORDER — FLUCONAZOLE 150 MG PO TABS: 150.0000 mg | ORAL_TABLET | Freq: Once | ORAL | 0 refills | Status: AC

## 2020-09-12 MED ORDER — SULFAMETHOXAZOLE-TRIMETHOPRIM 800-160 MG PO TABS: 1.0000 | ORAL_TABLET | Freq: Two times a day (BID) | ORAL | 0 refills | Status: AC

## 2020-09-12 MED ORDER — SULFAMETHOXAZOLE-TRIMETHOPRIM 800-160 MG PO TABS
1.0000 | ORAL_TABLET | Freq: Once | ORAL | Status: AC
  Administered 2020-09-12: 1 via ORAL
  Filled 2020-09-12: qty 1
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
## 2020-09-14 ENCOUNTER — Telehealth: Payer: Self-pay

## 2020-09-14 NOTE — Telephone Encounter (Signed)
Transition Care Management Follow-up Telephone Call Date of discharge and from where: 09/14/2020-[facility]  How have you been since you were released from the hospital? Patient stated she is doing good.  Any questions or concerns? No  Items Reviewed: Did the pt receive and understand the discharge instructions provided? Yes  Medications obtained and verified? Yes  Other? No  Any new allergies since your discharge? No  Dietary orders reviewed? N/A Do you have support at home? Yes   Home Care and Equipment/Supplies: Were home health services ordered? not applicable If so, what is the name of the agency? N/A  Has the agency set up a time to come to the patient's home? not applicable Were any new equipment or medical supplies ordered?  No What is the name of the medical supply agency? N/A Were you able to get the supplies/equipment? not applicable Do you have any questions related to the use of the equipment or supplies? No  Functional Questionnaire: (I = Independent and D = Dependent) ADLs: I  Bathing/Dressing- I  Meal Prep- I  Eating- I  Maintaining continence- I  Transferring/Ambulation- I  Managing Meds- I  Follow up appointments reviewed:  PCP Hospital f/u appt confirmed? No  . Specialist Hospital f/u appt confirmed? Yes  Scheduled to see [facility] on 09/17/2020 @ 10:20 am . Are transportation arrangements needed? No  If their condition worsens, is the pt aware to call PCP or go to the Emergency Dept.? Yes Was the patient provided with contact information for the PCP's office or ED? Yes Was to pt encouraged to call back with questions or concerns? Yes

## 2020-09-16 DIAGNOSIS — S329XXD Fracture of unspecified parts of lumbosacral spine and pelvis, subsequent encounter for fracture with routine healing: Secondary | ICD-10-CM | POA: Diagnosis not present

## 2020-09-17 DIAGNOSIS — Z09 Encounter for follow-up examination after completed treatment for conditions other than malignant neoplasm: Secondary | ICD-10-CM | POA: Diagnosis not present

## 2020-09-17 DIAGNOSIS — S36113D Laceration of liver, unspecified degree, subsequent encounter: Secondary | ICD-10-CM | POA: Diagnosis not present

## 2020-09-21 DIAGNOSIS — Z419 Encounter for procedure for purposes other than remedying health state, unspecified: Secondary | ICD-10-CM | POA: Diagnosis not present

## 2020-09-21 DIAGNOSIS — 419620001 Death: Secondary | SNOMED CT | POA: Diagnosis not present

## 2020-09-21 DEATH — deceased

## 2020-09-22 DIAGNOSIS — S060X9D Concussion with loss of consciousness of unspecified duration, subsequent encounter: Secondary | ICD-10-CM | POA: Diagnosis not present

## 2020-09-23 ENCOUNTER — Ambulatory Visit: Payer: Medicaid Other | Attending: Physician Assistant | Admitting: Physical Therapy

## 2020-09-23 ENCOUNTER — Other Ambulatory Visit: Payer: Self-pay

## 2020-09-23 ENCOUNTER — Encounter: Payer: Self-pay | Admitting: Physical Therapy

## 2020-09-23 ENCOUNTER — Ambulatory Visit: Payer: Medicaid Other | Admitting: Occupational Therapy

## 2020-09-23 DIAGNOSIS — R2681 Unsteadiness on feet: Secondary | ICD-10-CM | POA: Diagnosis not present

## 2020-09-23 DIAGNOSIS — R262 Difficulty in walking, not elsewhere classified: Secondary | ICD-10-CM

## 2020-09-23 DIAGNOSIS — R41844 Frontal lobe and executive function deficit: Secondary | ICD-10-CM | POA: Insufficient documentation

## 2020-09-23 DIAGNOSIS — M6281 Muscle weakness (generalized): Secondary | ICD-10-CM | POA: Insufficient documentation

## 2020-09-23 DIAGNOSIS — M25551 Pain in right hip: Secondary | ICD-10-CM | POA: Insufficient documentation

## 2020-09-23 DIAGNOSIS — M545 Low back pain, unspecified: Secondary | ICD-10-CM | POA: Diagnosis not present
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
## 2020-09-23 NOTE — Patient Instructions (Signed)
Access Code: [facility] URL: [email or web address] Date: 09/23/2020 Prepared by: Georgina Peer  Exercises Supine Active Straight Leg Raise - 2 x daily - 7 x weekly - 2 sets - 10 reps Sidelying Hip Abduction - 2 x daily - 7 x weekly - 2 sets - 10 reps Standing Hip Extension with Counter Support - 2 x daily - 7 x weekly - 2 sets - 10 reps Sitting Knee Extension with Resistance - 2 x daily - 7 x weekly - 2 sets - 10 reps Seated Hamstring Curl with Anchored Resistance - 2 x daily - 7 x weekly - 2 sets - 10 reps

## 2020-09-29 ENCOUNTER — Encounter: Payer: Self-pay | Admitting: Physical Therapy

## 2020-09-29 ENCOUNTER — Ambulatory Visit: Payer: Medicaid Other | Admitting: Occupational Therapy

## 2020-09-29 ENCOUNTER — Ambulatory Visit: Payer: Medicaid Other | Admitting: Physical Therapy

## 2020-09-29 ENCOUNTER — Encounter: Payer: Self-pay | Admitting: Occupational Therapy

## 2020-09-29 ENCOUNTER — Other Ambulatory Visit: Payer: Self-pay

## 2020-09-29 DIAGNOSIS — R2681 Unsteadiness on feet: Secondary | ICD-10-CM | POA: Diagnosis not present

## 2020-09-29 DIAGNOSIS — M545 Low back pain, unspecified: Secondary | ICD-10-CM

## 2020-09-29 DIAGNOSIS — R262 Difficulty in walking, not elsewhere classified: Secondary | ICD-10-CM

## 2020-09-29 DIAGNOSIS — M6281 Muscle weakness (generalized): Secondary | ICD-10-CM | POA: Diagnosis not present

## 2020-09-29 DIAGNOSIS — M25551 Pain in right hip: Secondary | ICD-10-CM

## 2020-09-29 DIAGNOSIS — R41844 Frontal lobe and executive function deficit: Secondary | ICD-10-CM
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
## 2020-10-01 ENCOUNTER — Other Ambulatory Visit: Payer: Self-pay

## 2020-10-01 ENCOUNTER — Encounter: Payer: Self-pay | Admitting: Physical Therapy

## 2020-10-01 ENCOUNTER — Ambulatory Visit: Payer: Medicaid Other | Admitting: Physical Therapy

## 2020-10-01 DIAGNOSIS — R262 Difficulty in walking, not elsewhere classified: Secondary | ICD-10-CM | POA: Diagnosis not present

## 2020-10-01 DIAGNOSIS — M545 Low back pain, unspecified: Secondary | ICD-10-CM | POA: Diagnosis not present

## 2020-10-01 DIAGNOSIS — R2681 Unsteadiness on feet: Secondary | ICD-10-CM | POA: Diagnosis not present

## 2020-10-01 DIAGNOSIS — M25551 Pain in right hip: Secondary | ICD-10-CM

## 2020-10-01 DIAGNOSIS — M6281 Muscle weakness (generalized): Secondary | ICD-10-CM

## 2020-10-01 DIAGNOSIS — R41844 Frontal lobe and executive function deficit: Secondary | ICD-10-CM | POA: Diagnosis not present
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
## 2020-10-07 ENCOUNTER — Other Ambulatory Visit: Payer: Self-pay

## 2020-10-07 ENCOUNTER — Encounter: Payer: Medicaid Other | Admitting: Occupational Therapy

## 2020-10-07 ENCOUNTER — Encounter: Payer: Self-pay | Admitting: Physical Therapy

## 2020-10-07 ENCOUNTER — Ambulatory Visit: Payer: Medicaid Other | Admitting: Physical Therapy

## 2020-10-07 DIAGNOSIS — R2681 Unsteadiness on feet: Secondary | ICD-10-CM

## 2020-10-07 DIAGNOSIS — M545 Low back pain, unspecified: Secondary | ICD-10-CM

## 2020-10-07 DIAGNOSIS — M25551 Pain in right hip: Secondary | ICD-10-CM

## 2020-10-07 DIAGNOSIS — R41844 Frontal lobe and executive function deficit: Secondary | ICD-10-CM | POA: Diagnosis not present

## 2020-10-07 DIAGNOSIS — R262 Difficulty in walking, not elsewhere classified: Secondary | ICD-10-CM | POA: Diagnosis not present

## 2020-10-07 DIAGNOSIS — M6281 Muscle weakness (generalized): Secondary | ICD-10-CM | POA: Diagnosis not present

## 2020-10-07 DIAGNOSIS — S329XXD Fracture of unspecified parts of lumbosacral spine and pelvis, subsequent encounter for fracture with routine healing: Secondary | ICD-10-CM | POA: Diagnosis not present
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
## 2020-10-09 ENCOUNTER — Encounter: Payer: Self-pay | Admitting: Physical Therapy

## 2020-10-09 ENCOUNTER — Encounter: Payer: Medicaid Other | Admitting: Occupational Therapy

## 2020-10-09 ENCOUNTER — Ambulatory Visit: Payer: Medicaid Other | Admitting: Physical Therapy

## 2020-10-09 ENCOUNTER — Other Ambulatory Visit: Payer: Self-pay

## 2020-10-09 DIAGNOSIS — M6281 Muscle weakness (generalized): Secondary | ICD-10-CM | POA: Diagnosis not present

## 2020-10-09 DIAGNOSIS — R41844 Frontal lobe and executive function deficit: Secondary | ICD-10-CM | POA: Diagnosis not present

## 2020-10-09 DIAGNOSIS — M25551 Pain in right hip: Secondary | ICD-10-CM

## 2020-10-09 DIAGNOSIS — M545 Low back pain, unspecified: Secondary | ICD-10-CM | POA: Diagnosis not present

## 2020-10-09 DIAGNOSIS — R262 Difficulty in walking, not elsewhere classified: Secondary | ICD-10-CM

## 2020-10-09 DIAGNOSIS — R2681 Unsteadiness on feet: Secondary | ICD-10-CM | POA: Diagnosis not present
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
## 2020-10-12 ENCOUNTER — Encounter: Payer: Medicaid Other | Admitting: Occupational Therapy

## 2020-10-12 ENCOUNTER — Ambulatory Visit: Payer: Medicaid Other | Admitting: Physical Therapy

## 2020-10-13 ENCOUNTER — Ambulatory Visit: Payer: Medicaid Other | Admitting: Physical Therapy

## 2020-10-13 ENCOUNTER — Other Ambulatory Visit: Payer: Self-pay

## 2020-10-13 ENCOUNTER — Encounter: Payer: Self-pay | Admitting: Physical Therapy

## 2020-10-13 DIAGNOSIS — R41844 Frontal lobe and executive function deficit: Secondary | ICD-10-CM | POA: Diagnosis not present

## 2020-10-13 DIAGNOSIS — R262 Difficulty in walking, not elsewhere classified: Secondary | ICD-10-CM

## 2020-10-13 DIAGNOSIS — M6281 Muscle weakness (generalized): Secondary | ICD-10-CM

## 2020-10-13 DIAGNOSIS — M25551 Pain in right hip: Secondary | ICD-10-CM

## 2020-10-13 DIAGNOSIS — M545 Low back pain, unspecified: Secondary | ICD-10-CM | POA: Diagnosis not present

## 2020-10-13 DIAGNOSIS — R2681 Unsteadiness on feet: Secondary | ICD-10-CM
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
## 2020-10-15 ENCOUNTER — Encounter: Payer: Medicaid Other | Admitting: Occupational Therapy

## 2020-10-15 ENCOUNTER — Encounter: Payer: Self-pay | Admitting: Physical Therapy

## 2020-10-15 ENCOUNTER — Ambulatory Visit: Payer: Medicaid Other | Admitting: Physical Therapy

## 2020-10-15 ENCOUNTER — Other Ambulatory Visit: Payer: Self-pay

## 2020-10-15 DIAGNOSIS — M545 Low back pain, unspecified: Secondary | ICD-10-CM

## 2020-10-15 DIAGNOSIS — R2681 Unsteadiness on feet: Secondary | ICD-10-CM

## 2020-10-15 DIAGNOSIS — R262 Difficulty in walking, not elsewhere classified: Secondary | ICD-10-CM

## 2020-10-15 DIAGNOSIS — M25551 Pain in right hip: Secondary | ICD-10-CM

## 2020-10-15 DIAGNOSIS — R41844 Frontal lobe and executive function deficit: Secondary | ICD-10-CM | POA: Diagnosis not present

## 2020-10-15 DIAGNOSIS — M6281 Muscle weakness (generalized): Secondary | ICD-10-CM | POA: Diagnosis not present
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
## 2020-10-19 ENCOUNTER — Encounter: Payer: Medicaid Other | Admitting: Occupational Therapy

## 2020-10-19 ENCOUNTER — Encounter: Payer: Medicaid Other | Admitting: Rehabilitative and Restorative Service Providers"

## 2020-10-22 ENCOUNTER — Ambulatory Visit: Payer: Medicaid Other | Attending: Physician Assistant | Admitting: Physical Therapy

## 2020-10-22 ENCOUNTER — Encounter: Payer: Self-pay | Admitting: Physical Therapy

## 2020-10-22 ENCOUNTER — Other Ambulatory Visit: Payer: Self-pay

## 2020-10-22 ENCOUNTER — Encounter: Payer: Medicaid Other | Admitting: Occupational Therapy

## 2020-10-22 DIAGNOSIS — M545 Low back pain, unspecified: Secondary | ICD-10-CM | POA: Diagnosis not present

## 2020-10-22 DIAGNOSIS — M25551 Pain in right hip: Secondary | ICD-10-CM | POA: Diagnosis not present

## 2020-10-22 DIAGNOSIS — M6281 Muscle weakness (generalized): Secondary | ICD-10-CM | POA: Insufficient documentation

## 2020-10-22 DIAGNOSIS — Z419 Encounter for procedure for purposes other than remedying health state, unspecified: Secondary | ICD-10-CM | POA: Diagnosis not present

## 2020-10-22 DIAGNOSIS — R262 Difficulty in walking, not elsewhere classified: Secondary | ICD-10-CM | POA: Diagnosis not present

## 2020-10-22 DIAGNOSIS — R2681 Unsteadiness on feet: Secondary | ICD-10-CM | POA: Diagnosis not present

## 2020-10-22 NOTE — Patient Instructions (Signed)
Access Code: [email or web address] URL: [email or web address] Date: 10/22/2020 Prepared by: Georgina Peer  Exercises Single Leg Bridge - 2 x daily - 7 x weekly - 2 sets - 10 reps Standing Hip Extension with Anchored Resistance - 2 x daily - 7 x weekly - 2 sets - 10 reps Standing Hip Abduction with Anchored Resistance - 2 x daily - 7 x weekly - 2 sets - 10 reps Standing Hip Adduction with Anchored Resistance - 2 x daily - 7 x weekly - 2 sets - 10 reps Standing Hip Flexion with Anchored Resistance and Chair Support - 2 x daily - 7 x weekly - 2 sets - 10 reps Seated Figure 4 Piriformis Stretch - 2 x daily - 7 x weekly - 2 sets - 30 sec hold Forward Step Down - 2 x daily - 7 x weekly - 2 sets - 10 reps
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
## 2020-10-27 ENCOUNTER — Ambulatory Visit: Payer: Medicaid Other | Admitting: Physical Therapy

## 2020-11-03 DIAGNOSIS — S329XXD Fracture of unspecified parts of lumbosacral spine and pelvis, subsequent encounter for fracture with routine healing: Secondary | ICD-10-CM | POA: Diagnosis not present

## 2020-11-13 DIAGNOSIS — H5213 Myopia, bilateral: Secondary | ICD-10-CM | POA: Diagnosis not present

## 2020-11-21 DIAGNOSIS — Z419 Encounter for procedure for purposes other than remedying health state, unspecified: Secondary | ICD-10-CM | POA: Diagnosis not present

## 2020-11-30 ENCOUNTER — Other Ambulatory Visit (HOSPITAL_COMMUNITY)
Admission: RE | Admit: 2020-11-30 | Discharge: 2020-11-30 | Disposition: A | Discharge status: Home / Self Care | Payer: Medicaid Other | Source: Ambulatory Visit | Attending: Obstetrics and Gynecology | Admitting: Obstetrics and Gynecology

## 2020-11-30 ENCOUNTER — Other Ambulatory Visit: Payer: Self-pay

## 2020-11-30 ENCOUNTER — Ambulatory Visit (INDEPENDENT_AMBULATORY_CARE_PROVIDER_SITE_OTHER): Payer: Medicaid Other | Admitting: *Deleted

## 2020-11-30 VITALS — BP 115/76 | HR 87 | Temp 98.0°F | Ht 62.0 in | Wt 137.2 lb

## 2020-11-30 DIAGNOSIS — Z23 Encounter for immunization: Secondary | ICD-10-CM | POA: Diagnosis not present

## 2020-11-30 DIAGNOSIS — N898 Other specified noninflammatory disorders of vagina: Secondary | ICD-10-CM | POA: Insufficient documentation

## 2020-11-30 NOTE — Progress Notes (Signed)
    SUBJECTIVE:  20 y.o. female complains of malodorous and yellow vaginal discharge for 2 month(s). Denies abnormal vaginal bleeding or significant pelvic pain or fever. No UTI symptoms. Denies history of known exposure to STD. Last sexual activity was 1 week ago unprotected.  No LMP recorded.  OBJECTIVE:  She appears well, afebrile. Urine dipstick: not done.  ASSESSMENT:  Vaginal Discharge  Vaginal Odor   PLAN:  GC, chlamydia, trichomonas, BVAG, CVAG probe sent to lab. Treatment: To be determined once lab results are received ROV prn if symptoms persist or worsen.   Flu vaccine given  Clovis Pu, RN

## 2020-12-02 LAB — CERVICOVAGINAL ANCILLARY ONLY
Bacterial Vaginitis (gardnerella): POSITIVE — AB
Candida Glabrata: NEGATIVE
Candida Vaginitis: NEGATIVE
Chlamydia: NEGATIVE
Comment: NEGATIVE
Comment: NEGATIVE
Comment: NEGATIVE
Comment: NEGATIVE
Comment: NEGATIVE
Comment: NORMAL
Neisseria Gonorrhea: NEGATIVE
Trichomonas: NEGATIVE

## 2020-12-04 ENCOUNTER — Other Ambulatory Visit: Payer: Self-pay | Admitting: *Deleted

## 2020-12-04 DIAGNOSIS — B9689 Other specified bacterial agents as the cause of diseases classified elsewhere: Secondary | ICD-10-CM

## 2020-12-04 DIAGNOSIS — N760 Acute vaginitis: Secondary | ICD-10-CM

## 2020-12-04 MED ORDER — METRONIDAZOLE 500 MG PO TABS: 500.0000 mg | ORAL_TABLET | Freq: Two times a day (BID) | ORAL | 0 refills | Status: DC

## 2020-12-21 DIAGNOSIS — S329XXD Fracture of unspecified parts of lumbosacral spine and pelvis, subsequent encounter for fracture with routine healing: Secondary | ICD-10-CM | POA: Diagnosis not present

## 2020-12-22 DIAGNOSIS — Z419 Encounter for procedure for purposes other than remedying health state, unspecified: Secondary | ICD-10-CM | POA: Diagnosis not present

## 2021-01-04 ENCOUNTER — Other Ambulatory Visit (HOSPITAL_COMMUNITY)
Admission: RE | Admit: 2021-01-04 | Discharge: 2021-01-04 | Disposition: A | Discharge status: Home / Self Care | Payer: Medicaid Other | Source: Ambulatory Visit | Attending: Obstetrics and Gynecology | Admitting: Obstetrics and Gynecology

## 2021-01-04 ENCOUNTER — Other Ambulatory Visit: Payer: Self-pay

## 2021-01-04 ENCOUNTER — Ambulatory Visit (INDEPENDENT_AMBULATORY_CARE_PROVIDER_SITE_OTHER): Payer: Medicaid Other | Admitting: *Deleted

## 2021-01-04 VITALS — BP 122/76 | HR 97 | Temp 97.9°F | Ht 62.0 in | Wt 136.4 lb

## 2021-01-04 DIAGNOSIS — N898 Other specified noninflammatory disorders of vagina: Secondary | ICD-10-CM | POA: Insufficient documentation

## 2021-01-04 DIAGNOSIS — Z3201 Encounter for pregnancy test, result positive: Secondary | ICD-10-CM | POA: Diagnosis not present

## 2021-01-04 DIAGNOSIS — Z3480 Encounter for supervision of other normal pregnancy, unspecified trimester: Secondary | ICD-10-CM | POA: Diagnosis present

## 2021-01-04 DIAGNOSIS — O26899 Other specified pregnancy related conditions, unspecified trimester: Secondary | ICD-10-CM

## 2021-01-04 DIAGNOSIS — Z3687 Encounter for antenatal screening for uncertain dates: Secondary | ICD-10-CM

## 2021-01-04 DIAGNOSIS — O26891 Other specified pregnancy related conditions, first trimester: Secondary | ICD-10-CM | POA: Diagnosis not present

## 2021-01-04 DIAGNOSIS — Z3A01 Less than 8 weeks gestation of pregnancy: Secondary | ICD-10-CM | POA: Insufficient documentation

## 2021-01-04 LAB — POCT URINE PREGNANCY: Preg Test, Ur: POSITIVE — AB

## 2021-01-04 MED ORDER — PRENATAL PLUS VITAMIN/MINERAL 27-1 MG PO TABS: 1.0000 | ORAL_TABLET | Freq: Every day | ORAL | 12 refills | Status: DC
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
## 2021-01-06 ENCOUNTER — Other Ambulatory Visit: Payer: Self-pay | Admitting: *Deleted

## 2021-01-06 DIAGNOSIS — O26899 Other specified pregnancy related conditions, unspecified trimester: Secondary | ICD-10-CM

## 2021-01-06 DIAGNOSIS — B379 Candidiasis, unspecified: Secondary | ICD-10-CM

## 2021-01-06 DIAGNOSIS — N898 Other specified noninflammatory disorders of vagina: Secondary | ICD-10-CM

## 2021-01-06 LAB — CERVICOVAGINAL ANCILLARY ONLY
Bacterial Vaginitis (gardnerella): NEGATIVE
Candida Glabrata: NEGATIVE
Candida Vaginitis: POSITIVE — AB
Chlamydia: NEGATIVE
Comment: NEGATIVE
Comment: NEGATIVE
Comment: NEGATIVE
Comment: NEGATIVE
Comment: NEGATIVE
Comment: NORMAL
Neisseria Gonorrhea: NEGATIVE
Trichomonas: NEGATIVE

## 2021-01-06 MED ORDER — TERCONAZOLE 0.4 % VA CREA: 1.0000 | TOPICAL_CREAM | Freq: Every day | VAGINAL | 0 refills | Status: AC

## 2021-01-18 ENCOUNTER — Other Ambulatory Visit: Payer: Self-pay | Admitting: Obstetrics and Gynecology

## 2021-01-18 ENCOUNTER — Other Ambulatory Visit: Payer: Self-pay

## 2021-01-18 ENCOUNTER — Ambulatory Visit
Admission: RE | Admit: 2021-01-18 | Discharge: 2021-01-18 | Disposition: A | Discharge status: Home / Self Care | Payer: Medicaid Other | Source: Ambulatory Visit | Attending: Obstetrics and Gynecology | Admitting: Obstetrics and Gynecology

## 2021-01-18 DIAGNOSIS — Z3687 Encounter for antenatal screening for uncertain dates: Secondary | ICD-10-CM | POA: Diagnosis not present

## 2021-01-18 DIAGNOSIS — Z3491 Encounter for supervision of normal pregnancy, unspecified, first trimester: Secondary | ICD-10-CM | POA: Diagnosis not present

## 2021-01-18 DIAGNOSIS — Z3480 Encounter for supervision of other normal pregnancy, unspecified trimester: Secondary | ICD-10-CM | POA: Insufficient documentation

## 2021-01-18 DIAGNOSIS — Z3A10 10 weeks gestation of pregnancy: Secondary | ICD-10-CM | POA: Diagnosis not present

## 2021-01-21 ENCOUNTER — Encounter: Payer: Medicaid Other | Admitting: Obstetrics and Gynecology

## 2021-01-21 DIAGNOSIS — Z419 Encounter for procedure for purposes other than remedying health state, unspecified: Secondary | ICD-10-CM | POA: Diagnosis not present

## 2021-01-27 ENCOUNTER — Encounter: Payer: Medicaid Other | Admitting: Obstetrics and Gynecology

## 2021-02-09 DIAGNOSIS — S329XXD Fracture of unspecified parts of lumbosacral spine and pelvis, subsequent encounter for fracture with routine healing: Secondary | ICD-10-CM | POA: Diagnosis not present

## 2021-02-12 ENCOUNTER — Other Ambulatory Visit (HOSPITAL_COMMUNITY)
Admission: RE | Admit: 2021-02-12 | Discharge: 2021-02-12 | Disposition: A | Discharge status: Home / Self Care | Payer: Medicaid Other | Source: Ambulatory Visit | Attending: Certified Nurse Midwife | Admitting: Certified Nurse Midwife

## 2021-02-12 ENCOUNTER — Ambulatory Visit (INDEPENDENT_AMBULATORY_CARE_PROVIDER_SITE_OTHER): Payer: Medicaid Other | Admitting: Certified Nurse Midwife

## 2021-02-12 ENCOUNTER — Other Ambulatory Visit: Payer: Self-pay

## 2021-02-12 VITALS — BP 117/69 | HR 120 | Temp 98.5°F | Wt 133.4 lb

## 2021-02-12 DIAGNOSIS — O34219 Maternal care for unspecified type scar from previous cesarean delivery: Secondary | ICD-10-CM | POA: Diagnosis not present

## 2021-02-12 DIAGNOSIS — O0932 Supervision of pregnancy with insufficient antenatal care, second trimester: Secondary | ICD-10-CM

## 2021-02-12 DIAGNOSIS — Z3492 Encounter for supervision of normal pregnancy, unspecified, second trimester: Secondary | ICD-10-CM

## 2021-02-12 DIAGNOSIS — Z8781 Personal history of (healed) traumatic fracture: Secondary | ICD-10-CM | POA: Diagnosis not present

## 2021-02-12 DIAGNOSIS — O98512 Other viral diseases complicating pregnancy, second trimester: Secondary | ICD-10-CM | POA: Diagnosis not present

## 2021-02-12 DIAGNOSIS — B009 Herpesviral infection, unspecified: Secondary | ICD-10-CM | POA: Diagnosis not present

## 2021-02-12 DIAGNOSIS — Z8759 Personal history of other complications of pregnancy, childbirth and the puerperium: Secondary | ICD-10-CM

## 2021-02-12 DIAGNOSIS — Z3A14 14 weeks gestation of pregnancy: Secondary | ICD-10-CM | POA: Insufficient documentation

## 2021-02-13 LAB — OBSTETRIC PANEL, INCLUDING HIV
Antibody Screen: NEGATIVE
Basophils Absolute: 0 10*3/uL (ref 0.0–0.2)
Basos: 0 %
EOS (ABSOLUTE): 0 10*3/uL (ref 0.0–0.4)
Eos: 0 %
HIV Screen 4th Generation wRfx: NONREACTIVE
Hematocrit: 36 % (ref 34.0–46.6)
Hemoglobin: 11.9 g/dL (ref 11.1–15.9)
Hepatitis B Surface Ag: NEGATIVE
Immature Grans (Abs): 0.1 10*3/uL (ref 0.0–0.1)
Immature Granulocytes: 1 %
Lymphocytes Absolute: 1.6 10*3/uL (ref 0.7–3.1)
Lymphs: 18 %
MCH: 27 pg (ref 26.6–33.0)
MCHC: 33.1 g/dL (ref 31.5–35.7)
MCV: 82 fL (ref 79–97)
Monocytes Absolute: 0.6 10*3/uL (ref 0.1–0.9)
Monocytes: 7 %
Neutrophils Absolute: 6.2 10*3/uL (ref 1.4–7.0)
Neutrophils: 74 %
Platelets: 280 10*3/uL (ref 150–450)
RBC: 4.4 x10E6/uL (ref 3.77–5.28)
RDW: 12.4 % (ref 11.7–15.4)
RPR Ser Ql: NONREACTIVE
Rh Factor: POSITIVE
Rubella Antibodies, IGG: 3.51 index (ref >0.99)
WBC: 8.5 10*3/uL (ref 3.4–10.8)

## 2021-02-13 LAB — COMPREHENSIVE METABOLIC PANEL
ALT: 13 IU/L (ref 0–32)
AST: 16 IU/L (ref 0–40)
Albumin/Globulin Ratio: 1.6 (ref 1.2–2.2)
Albumin: 4.5 g/dL (ref 3.9–5.0)
Alkaline Phosphatase: 64 IU/L (ref 44–121)
BUN/Creatinine Ratio: 15 (ref 9–23)
BUN: 8 mg/dL (ref 6–20)
Bilirubin Total: 0.5 mg/dL (ref 0.0–1.2)
CO2: 24 mmol/L (ref 20–29)
Calcium: 9.7 mg/dL (ref 8.7–10.2)
Chloride: 100 mmol/L (ref 96–106)
Creatinine, Ser: 0.53 mg/dL — ABNORMAL LOW (ref 0.57–1.00)
Globulin, Total: 2.8 g/dL (ref 1.5–4.5)
Glucose: 81 mg/dL (ref 70–99)
Potassium: 4 mmol/L (ref 3.5–5.2)
Sodium: 136 mmol/L (ref 134–144)
Total Protein: 7.3 g/dL (ref 6.0–8.5)
eGFR: 134 mL/min/{1.73_m2} (ref >59)

## 2021-02-13 LAB — HEMOGLOBIN A1C
Est. average glucose Bld gHb Est-mCnc: 111 mg/dL
Hgb A1c MFr Bld: 5.5 % (ref 4.8–5.6)

## 2021-02-13 LAB — PROTEIN / CREATININE RATIO, URINE
Creatinine, Urine: 248.6 mg/dL
Protein, Ur: 18.4 mg/dL
Protein/Creat Ratio: 74 mg/g creat (ref 0–200)

## 2021-02-13 LAB — HEPATITIS C ANTIBODY: Hep C Virus Ab: 0.2 s/co ratio (ref 0.0–0.9)

## 2021-02-14 MED ORDER — ASPIRIN EC 81 MG PO TBEC: 81.0000 mg | DELAYED_RELEASE_TABLET | Freq: Every day | ORAL | 2 refills | Status: DC
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
## 2021-02-16 LAB — URINE CULTURE, OB REFLEX

## 2021-02-16 LAB — CULTURE, OB URINE

## 2021-02-21 ENCOUNTER — Other Ambulatory Visit: Payer: Self-pay

## 2021-02-21 ENCOUNTER — Encounter (HOSPITAL_COMMUNITY): Payer: Self-pay | Admitting: Obstetrics and Gynecology

## 2021-02-21 ENCOUNTER — Inpatient Hospital Stay (HOSPITAL_COMMUNITY)
Admission: AD | Admit: 2021-02-21 | Discharge: 2021-02-22 | Disposition: A | Discharge status: Home / Self Care | Payer: Medicaid Other | Attending: Obstetrics and Gynecology | Admitting: Obstetrics and Gynecology

## 2021-02-21 DIAGNOSIS — O26892 Other specified pregnancy related conditions, second trimester: Secondary | ICD-10-CM | POA: Insufficient documentation

## 2021-02-21 DIAGNOSIS — Z419 Encounter for procedure for purposes other than remedying health state, unspecified: Secondary | ICD-10-CM | POA: Diagnosis not present

## 2021-02-21 DIAGNOSIS — M79606 Pain in leg, unspecified: Secondary | ICD-10-CM | POA: Insufficient documentation

## 2021-02-21 DIAGNOSIS — O98512 Other viral diseases complicating pregnancy, second trimester: Secondary | ICD-10-CM | POA: Insufficient documentation

## 2021-02-21 DIAGNOSIS — R519 Headache, unspecified: Secondary | ICD-10-CM | POA: Insufficient documentation

## 2021-02-21 DIAGNOSIS — Z3A15 15 weeks gestation of pregnancy: Secondary | ICD-10-CM

## 2021-02-21 DIAGNOSIS — Z3480 Encounter for supervision of other normal pregnancy, unspecified trimester: Secondary | ICD-10-CM

## 2021-02-21 DIAGNOSIS — O219 Vomiting of pregnancy, unspecified: Secondary | ICD-10-CM | POA: Insufficient documentation

## 2021-02-21 DIAGNOSIS — U071 COVID-19: Secondary | ICD-10-CM | POA: Insufficient documentation

## 2021-02-21 DIAGNOSIS — M549 Dorsalgia, unspecified: Secondary | ICD-10-CM | POA: Insufficient documentation

## 2021-02-21 HISTORY — DX: Fracture of unspecified parts of lumbosacral spine and pelvis, initial encounter for closed fracture: S32.9XXA
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
## 2021-02-22 ENCOUNTER — Encounter (HOSPITAL_COMMUNITY): Payer: Self-pay | Admitting: Obstetrics and Gynecology

## 2021-02-22 DIAGNOSIS — M79606 Pain in leg, unspecified: Secondary | ICD-10-CM | POA: Diagnosis not present

## 2021-02-22 DIAGNOSIS — O219 Vomiting of pregnancy, unspecified: Secondary | ICD-10-CM

## 2021-02-22 DIAGNOSIS — U071 COVID-19: Secondary | ICD-10-CM | POA: Diagnosis not present

## 2021-02-22 DIAGNOSIS — O98512 Other viral diseases complicating pregnancy, second trimester: Secondary | ICD-10-CM | POA: Diagnosis not present

## 2021-02-22 DIAGNOSIS — O26892 Other specified pregnancy related conditions, second trimester: Secondary | ICD-10-CM | POA: Diagnosis not present

## 2021-02-22 DIAGNOSIS — M549 Dorsalgia, unspecified: Secondary | ICD-10-CM | POA: Diagnosis not present

## 2021-02-22 DIAGNOSIS — Z3A15 15 weeks gestation of pregnancy: Secondary | ICD-10-CM

## 2021-02-22 DIAGNOSIS — R519 Headache, unspecified: Secondary | ICD-10-CM | POA: Diagnosis not present

## 2021-02-22 LAB — URINALYSIS, ROUTINE W REFLEX MICROSCOPIC
Bacteria, UA: NONE SEEN
Bilirubin Urine: NEGATIVE
Glucose, UA: NEGATIVE mg/dL
Hgb urine dipstick: NEGATIVE
Ketones, ur: 80 mg/dL — AB
Leukocytes,Ua: NEGATIVE
Nitrite: NEGATIVE
Protein, ur: 30 mg/dL — AB
Specific Gravity, Urine: 1.023 (ref 1.005–1.030)
pH: 6 (ref 5.0–8.0)

## 2021-02-22 LAB — RESP PANEL BY RT-PCR (FLU A&B, COVID) ARPGX2
Influenza A by PCR: NEGATIVE
Influenza B by PCR: NEGATIVE
SARS Coronavirus 2 by RT PCR: POSITIVE — AB

## 2021-02-22 MED ORDER — ACETAMINOPHEN 500 MG PO TABS
1000.0000 mg | ORAL_TABLET | Freq: Once | ORAL | Status: AC
  Administered 2021-02-22: 1000 mg via ORAL
  Filled 2021-02-22: qty 2

## 2021-02-22 MED ORDER — LACTATED RINGERS IV BOLUS
1000.0000 mL | Freq: Once | INTRAVENOUS | Status: AC
  Administered 2021-02-22: 1000 mL via INTRAVENOUS

## 2021-02-22 MED ORDER — PROMETHAZINE HCL 25 MG PO TABS: 25.0000 mg | ORAL_TABLET | Freq: Four times a day (QID) | ORAL | 0 refills | Status: DC | PRN

## 2021-02-22 MED ORDER — ONDANSETRON HCL 4 MG/2ML IJ SOLN
4.0000 mg | Freq: Once | INTRAMUSCULAR | Status: AC
  Administered 2021-02-22: 4 mg via INTRAVENOUS
  Filled 2021-02-22: qty 2

## 2021-02-22 NOTE — Discharge Instructions (Signed)

## 2021-02-22 NOTE — Plan of Care (Deleted)

## 2021-02-22 NOTE — MAU Provider Note (Signed)
History     CSN: [id number]  Arrival date and time: 02/21/21 2242   Event Date/Time   First Provider Initiated Contact with Patient 02/22/21 0020      Chief Complaint  Patient presents with   Back Pain   Leg Pain   HPI Shannon Hogan is a 21 y.o. G2P1001 at [redacted]w[redacted]d who presents with back pain, leg pain, nausea, vomiting.  Symptoms started this morning.  Reports sharp back pain that radiates down both legs.  Rates pain 10/10.  Has not treated symptoms.  Nothing makes better or worse.  Also has headache.  Has vomited all day and has been unable to keep anything down.  Does not have antiemetic at home.  Denies sore throat, cough, abdominal pain, diarrhea, dysuria, vaginal bleeding.  OB History     Gravida  2   Para  1   Term  1   Preterm  0   AB  0   Living  1      SAB  0   IAB  0   Ectopic  0   Multiple      Live Births  1           Past Medical History:  Diagnosis Date   Pelvis fracture (HCC)    Pregnancy induced hypertension     Past Surgical History:  Procedure Laterality Date   CESAREAN SECTION N/A 02/24/2019   Procedure: CESAREAN SECTION;  Surgeon: Conan Bowens, MD;  [facility];  Service: Obstetrics;  Laterality: N/A;   SACRO-ILIAC PINNING N/A 08/26/2020   Procedure: SACRO-ILIAC PINNING;  Surgeon: Roby Lofts, MD;  Location: [facility] OR;  Service: Orthopedics;  Laterality: N/A;    Family History  Problem Relation Age of Onset   Stroke Paternal Grandmother    Glaucoma Paternal Grandmother    Diabetes Paternal Grandmother    Diabetes Mother    Hypertension Father     Social History   Tobacco Use   Smoking status: Never    Passive exposure: Never   Smokeless tobacco: Never  Vaping Use   Vaping Use: Never used  Substance Use Topics   Alcohol use: Not Currently    Comment: socially   Drug use: Not Currently    Types: Marijuana    Comment: last used in April 2020    Allergies: No Known Allergies  Medications Prior to  Admission  Medication Sig Dispense Refill Last Dose   Prenatal Vit-Fe Fumarate-FA (PRENATAL PLUS VITAMIN/MINERAL) 27-1 MG TABS Take 1 tablet by mouth daily at 6 (six) AM. 30 tablet 12 02/21/2021   aspirin EC 81 MG tablet Take 1 tablet (81 mg total) by mouth daily. Take after 12 weeks for prevention of preeclampsia later in pregnancy 300 tablet 2 Unknown    Review of Systems  Constitutional: Negative.   HENT: Negative.    Respiratory:  Negative for cough.   Gastrointestinal:  Positive for nausea and vomiting. Negative for abdominal pain, constipation and diarrhea.  Genitourinary:  Positive for pelvic pain. Negative for dysuria, vaginal bleeding and vaginal discharge.  Musculoskeletal:  Positive for back pain.  Neurological:  Positive for headaches.  Physical Exam   Blood pressure 120/73, pulse 67, temperature 97.8 F (36.6 C), temperature source Oral, resp. rate 18, height 5\' 2"  (1.575 m), weight 60.7 kg, last menstrual period 11/05/2020, SpO2 100 %, not currently breastfeeding.  Physical Exam Vitals and nursing note reviewed.  Constitutional:      Appearance: Normal appearance. She is ill-appearing.  HENT:     Head: Normocephalic and atraumatic.     Mouth/Throat:     Mouth: Mucous membranes are dry.  Eyes:     General: No scleral icterus.    Conjunctiva/sclera: Conjunctivae normal.  Cardiovascular:     Rate and Rhythm: Regular rhythm. Tachycardia present.  Pulmonary:     Effort: Pulmonary effort is normal. No respiratory distress.     Breath sounds: Normal breath sounds. No wheezing.  Abdominal:     General: Abdomen is flat.     Palpations: Abdomen is soft.     Tenderness: There is no abdominal tenderness.  Skin:    General: Skin is warm and dry.  Neurological:     General: No focal deficit present.     Mental Status: She is alert.  Psychiatric:        Mood and Affect: Mood normal.        Behavior: Behavior normal.    MAU Course  Procedures Results for orders placed  or performed during the hospital encounter of 02/21/21 (from the past 24 hour(s))  Urinalysis, Routine w reflex microscopic Urine, Clean Catch     Status: Abnormal   Collection Time: 02/22/21 12:32 AM  Result Value Ref Range   Color, Urine YELLOW YELLOW   APPearance HAZY (A) CLEAR   Specific Gravity, Urine 1.023 1.005 - 1.030   pH 6.0 5.0 - 8.0   Glucose, UA NEGATIVE NEGATIVE mg/dL   Hgb urine dipstick NEGATIVE NEGATIVE   Bilirubin Urine NEGATIVE NEGATIVE   Ketones, ur 80 (A) NEGATIVE mg/dL   Protein, ur 30 (A) NEGATIVE mg/dL   Nitrite NEGATIVE NEGATIVE   Leukocytes,Ua NEGATIVE NEGATIVE   RBC / HPF 0-5 0 - 5 RBC/hpf   WBC, UA 0-5 0 - 5 WBC/hpf   Bacteria, UA NONE SEEN NONE SEEN   Squamous Epithelial / LPF 11-20 0 - 5   Mucus PRESENT   Resp Panel by RT-PCR (Flu A&B, Covid) Nasopharyngeal Swab     Status: Abnormal   Collection Time: 02/22/21  1:04 AM   Specimen: Nasopharyngeal Swab; Nasopharyngeal(NP) swabs in vial transport medium  Result Value Ref Range   SARS Coronavirus 2 by RT PCR POSITIVE (A) NEGATIVE   Influenza A by PCR NEGATIVE NEGATIVE   Influenza B by PCR NEGATIVE NEGATIVE    MDM Fetal heart tones present via Doppler.  Temp on arrival is 99.3, and patient is tachycardic.  COVID swab came back positive.  Pelvic pain and leg pain likely body aches related to COVID.  Treated with IV fluids, Phenergan, and Tylenol while in MAU.  Patient reports feeling much better.  Assessment and Plan   1. COVID-19 affecting pregnancy in second trimester   2. Nausea and vomiting during pregnancy prior to [redacted] weeks gestation   3. [redacted] weeks gestation of pregnancy    -symptomatic tx, given list of OTC meds safe in pregnancy -Rx phenergan -reviewed reasons to return to MAU -quarantine  Judeth Horn 02/22/2021, 12:20 AM

## 2021-02-24 LAB — URINE CYTOLOGY ANCILLARY ONLY
Chlamydia: NEGATIVE
Comment: NEGATIVE
Comment: NEGATIVE
Comment: NORMAL
Neisseria Gonorrhea: NEGATIVE
Trichomonas: NEGATIVE

## 2021-03-04 ENCOUNTER — Telehealth: Payer: Self-pay | Admitting: *Deleted

## 2021-03-04 NOTE — Telephone Encounter (Signed)
Patient had called today requesting to speak with someone regarding [facility] results. 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 Advised patient to call [facility] and speak with one of the genetic counselors regarding results.   Derl Barrow, RN

## 2021-03-05 ENCOUNTER — Ambulatory Visit: Payer: Medicaid Other | Attending: Student | Admitting: Physical Therapy

## 2021-03-10 ENCOUNTER — Ambulatory Visit (INDEPENDENT_AMBULATORY_CARE_PROVIDER_SITE_OTHER): Payer: Medicaid Other | Admitting: Advanced Practice Midwife

## 2021-03-10 ENCOUNTER — Other Ambulatory Visit: Payer: Self-pay

## 2021-03-10 VITALS — BP 108/64 | HR 84 | Temp 98.1°F | Wt 136.2 lb

## 2021-03-10 DIAGNOSIS — Z3492 Encounter for supervision of normal pregnancy, unspecified, second trimester: Secondary | ICD-10-CM

## 2021-03-10 DIAGNOSIS — Z3A17 17 weeks gestation of pregnancy: Secondary | ICD-10-CM

## 2021-03-10 DIAGNOSIS — Z8781 Personal history of (healed) traumatic fracture: Secondary | ICD-10-CM
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
## 2021-03-19 ENCOUNTER — Telehealth: Payer: Self-pay | Admitting: Certified Nurse Midwife

## 2021-03-19 ENCOUNTER — Other Ambulatory Visit: Payer: Self-pay | Admitting: *Deleted

## 2021-03-19 ENCOUNTER — Other Ambulatory Visit: Payer: Self-pay

## 2021-03-19 ENCOUNTER — Ambulatory Visit: Payer: Medicaid Other | Attending: Advanced Practice Midwife

## 2021-03-19 ENCOUNTER — Other Ambulatory Visit: Payer: Self-pay | Admitting: Advanced Practice Midwife

## 2021-03-19 DIAGNOSIS — O99012 Anemia complicating pregnancy, second trimester: Secondary | ICD-10-CM | POA: Diagnosis not present

## 2021-03-19 DIAGNOSIS — Z148 Genetic carrier of other disease: Secondary | ICD-10-CM

## 2021-03-19 DIAGNOSIS — Z363 Encounter for antenatal screening for malformations: Secondary | ICD-10-CM

## 2021-03-19 DIAGNOSIS — D563 Thalassemia minor: Secondary | ICD-10-CM | POA: Diagnosis not present

## 2021-03-19 DIAGNOSIS — Z8759 Personal history of other complications of pregnancy, childbirth and the puerperium: Secondary | ICD-10-CM

## 2021-03-19 DIAGNOSIS — O4442 Low lying placenta NOS or without hemorrhage, second trimester: Secondary | ICD-10-CM

## 2021-03-19 DIAGNOSIS — Z3A19 19 weeks gestation of pregnancy: Secondary | ICD-10-CM | POA: Diagnosis not present

## 2021-03-19 DIAGNOSIS — O09292 Supervision of pregnancy with other poor reproductive or obstetric history, second trimester: Secondary | ICD-10-CM | POA: Diagnosis not present

## 2021-03-19 DIAGNOSIS — Z3492 Encounter for supervision of normal pregnancy, unspecified, second trimester: Secondary | ICD-10-CM

## 2021-03-19 DIAGNOSIS — O34219 Maternal care for unspecified type scar from previous cesarean delivery: Secondary | ICD-10-CM

## 2021-03-19 NOTE — Telephone Encounter (Signed)
Patient stated she had not been able to get her appointment because we had not sent records. She gave me a fax number. I called [facility] office and gave them her referral information. They will call and schedule patient.

## 2021-03-24 DIAGNOSIS — Z419 Encounter for procedure for purposes other than remedying health state, unspecified: Secondary | ICD-10-CM | POA: Diagnosis not present

## 2021-03-26 ENCOUNTER — Ambulatory Visit: Payer: Medicaid Other | Attending: Certified Nurse Midwife | Admitting: Physical Therapy

## 2021-03-26 ENCOUNTER — Encounter: Payer: Self-pay | Admitting: Advanced Practice Midwife

## 2021-03-26 ENCOUNTER — Other Ambulatory Visit: Payer: Self-pay

## 2021-03-26 ENCOUNTER — Encounter: Payer: Self-pay | Admitting: Physical Therapy

## 2021-03-26 DIAGNOSIS — O4440 Low lying placenta NOS or without hemorrhage, unspecified trimester: Secondary | ICD-10-CM | POA: Insufficient documentation

## 2021-03-26 DIAGNOSIS — R279 Unspecified lack of coordination: Secondary | ICD-10-CM | POA: Diagnosis not present

## 2021-03-26 DIAGNOSIS — M6281 Muscle weakness (generalized): Secondary | ICD-10-CM | POA: Diagnosis not present

## 2021-03-26 DIAGNOSIS — R269 Unspecified abnormalities of gait and mobility: Secondary | ICD-10-CM | POA: Diagnosis not present

## 2021-03-26 DIAGNOSIS — R293 Abnormal posture: Secondary | ICD-10-CM
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
## 2021-03-29 ENCOUNTER — Ambulatory Visit: Payer: Medicaid Other | Admitting: Physical Therapy

## 2021-04-02 ENCOUNTER — Ambulatory Visit: Payer: Medicaid Other | Attending: Obstetrics and Gynecology

## 2021-04-05 ENCOUNTER — Encounter: Payer: Self-pay | Admitting: Rehabilitative and Restorative Service Providers"

## 2021-04-05 ENCOUNTER — Other Ambulatory Visit: Payer: Self-pay

## 2021-04-05 ENCOUNTER — Ambulatory Visit: Payer: Medicaid Other | Admitting: Rehabilitative and Restorative Service Providers"

## 2021-04-05 DIAGNOSIS — R269 Unspecified abnormalities of gait and mobility: Secondary | ICD-10-CM | POA: Diagnosis not present

## 2021-04-05 DIAGNOSIS — M6281 Muscle weakness (generalized): Secondary | ICD-10-CM

## 2021-04-05 DIAGNOSIS — R279 Unspecified lack of coordination: Secondary | ICD-10-CM | POA: Diagnosis not present

## 2021-04-05 DIAGNOSIS — R293 Abnormal posture: Secondary | ICD-10-CM | POA: Diagnosis not present
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
## 2021-04-07 ENCOUNTER — Ambulatory Visit (INDEPENDENT_AMBULATORY_CARE_PROVIDER_SITE_OTHER): Payer: Medicaid Other | Admitting: Certified Nurse Midwife

## 2021-04-07 ENCOUNTER — Other Ambulatory Visit: Payer: Self-pay

## 2021-04-07 VITALS — BP 115/74 | HR 105 | Temp 98.4°F | Wt 147.0 lb

## 2021-04-07 DIAGNOSIS — Z3A21 21 weeks gestation of pregnancy: Secondary | ICD-10-CM

## 2021-04-07 DIAGNOSIS — Z3492 Encounter for supervision of normal pregnancy, unspecified, second trimester: Secondary | ICD-10-CM
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
## 2021-04-12 ENCOUNTER — Encounter: Payer: Self-pay | Admitting: Rehabilitative and Restorative Service Providers"

## 2021-04-19 ENCOUNTER — Telehealth: Payer: Self-pay | Admitting: Physical Therapy

## 2021-04-19 ENCOUNTER — Ambulatory Visit: Payer: Medicaid Other | Admitting: Physical Therapy

## 2021-04-19 NOTE — Telephone Encounter (Signed)
PT called pt about this morning's appointment at 0845. Pt did not answer, and unable to leave voicemail.   Otelia Sergeant, PT, DPT 02/27/239:23 AM

## 2021-04-21 DIAGNOSIS — Z419 Encounter for procedure for purposes other than remedying health state, unspecified: Secondary | ICD-10-CM | POA: Diagnosis not present

## 2021-05-06 ENCOUNTER — Encounter: Payer: Medicaid Other | Admitting: Obstetrics and Gynecology

## 2021-05-07 ENCOUNTER — Other Ambulatory Visit (HOSPITAL_COMMUNITY)
Admission: RE | Admit: 2021-05-07 | Discharge: 2021-05-07 | Disposition: A | Discharge status: Home / Self Care | Payer: Medicaid Other | Source: Ambulatory Visit

## 2021-05-07 ENCOUNTER — Ambulatory Visit (INDEPENDENT_AMBULATORY_CARE_PROVIDER_SITE_OTHER): Payer: Medicaid Other

## 2021-05-07 ENCOUNTER — Other Ambulatory Visit: Payer: Self-pay

## 2021-05-07 VITALS — BP 111/71 | HR 105 | Wt 152.0 lb

## 2021-05-07 DIAGNOSIS — Z3480 Encounter for supervision of other normal pregnancy, unspecified trimester: Secondary | ICD-10-CM

## 2021-05-07 DIAGNOSIS — O4440 Low lying placenta NOS or without hemorrhage, unspecified trimester: Secondary | ICD-10-CM

## 2021-05-07 DIAGNOSIS — Z98891 History of uterine scar from previous surgery: Secondary | ICD-10-CM

## 2021-05-07 DIAGNOSIS — Z3A26 26 weeks gestation of pregnancy: Secondary | ICD-10-CM

## 2021-05-07 DIAGNOSIS — R102 Pelvic and perineal pain: Secondary | ICD-10-CM

## 2021-05-07 DIAGNOSIS — O26892 Other specified pregnancy related conditions, second trimester: Secondary | ICD-10-CM
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
## 2021-05-11 ENCOUNTER — Telehealth: Payer: Self-pay | Admitting: *Deleted

## 2021-05-11 LAB — CERVICOVAGINAL ANCILLARY ONLY
Bacterial Vaginitis (gardnerella): POSITIVE — AB
Candida Glabrata: NEGATIVE
Candida Vaginitis: NEGATIVE
Chlamydia: NEGATIVE
Comment: NEGATIVE
Comment: NEGATIVE
Comment: NEGATIVE
Comment: NEGATIVE
Comment: NORMAL
Neisseria Gonorrhea: NEGATIVE

## 2021-05-11 MED ORDER — METRONIDAZOLE 500 MG PO TABS: 500.0000 mg | ORAL_TABLET | Freq: Two times a day (BID) | ORAL | 0 refills | Status: DC

## 2021-05-11 NOTE — Telephone Encounter (Signed)
Pt notified via my chart that is is positive for BV and RX for Flagyl was sent to her pharmacy per protocol. ?

## 2021-05-13 ENCOUNTER — Other Ambulatory Visit: Payer: Self-pay

## 2021-05-13 DIAGNOSIS — B9689 Other specified bacterial agents as the cause of diseases classified elsewhere: Secondary | ICD-10-CM

## 2021-05-13 MED ORDER — METRONIDAZOLE 0.75 % VA GEL: 1.0000 | Freq: Every day | VAGINAL | 1 refills | Status: DC

## 2021-05-21 ENCOUNTER — Ambulatory Visit: Payer: Medicaid Other | Admitting: *Deleted

## 2021-05-21 ENCOUNTER — Ambulatory Visit: Payer: Medicaid Other | Attending: Obstetrics and Gynecology

## 2021-05-21 ENCOUNTER — Encounter: Payer: Self-pay | Admitting: *Deleted

## 2021-05-21 VITALS — BP 121/67 | HR 97

## 2021-05-21 DIAGNOSIS — D649 Anemia, unspecified: Secondary | ICD-10-CM | POA: Diagnosis not present

## 2021-05-21 DIAGNOSIS — Z3A28 28 weeks gestation of pregnancy: Secondary | ICD-10-CM | POA: Diagnosis not present

## 2021-05-21 DIAGNOSIS — Z8759 Personal history of other complications of pregnancy, childbirth and the puerperium: Secondary | ICD-10-CM | POA: Diagnosis not present

## 2021-05-21 DIAGNOSIS — O4443 Low lying placenta NOS or without hemorrhage, third trimester: Secondary | ICD-10-CM

## 2021-05-21 DIAGNOSIS — O34219 Maternal care for unspecified type scar from previous cesarean delivery: Secondary | ICD-10-CM | POA: Diagnosis not present

## 2021-05-21 DIAGNOSIS — O09293 Supervision of pregnancy with other poor reproductive or obstetric history, third trimester: Secondary | ICD-10-CM | POA: Diagnosis not present

## 2021-05-21 DIAGNOSIS — O99013 Anemia complicating pregnancy, third trimester: Secondary | ICD-10-CM | POA: Diagnosis not present

## 2021-05-21 DIAGNOSIS — O4442 Low lying placenta NOS or without hemorrhage, second trimester: Secondary | ICD-10-CM | POA: Diagnosis not present

## 2021-05-21 DIAGNOSIS — O285 Abnormal chromosomal and genetic finding on antenatal screening of mother: Secondary | ICD-10-CM | POA: Diagnosis not present

## 2021-05-21 DIAGNOSIS — O99012 Anemia complicating pregnancy, second trimester: Secondary | ICD-10-CM | POA: Insufficient documentation

## 2021-05-21 DIAGNOSIS — O4440 Low lying placenta NOS or without hemorrhage, unspecified trimester: Secondary | ICD-10-CM

## 2021-05-21 DIAGNOSIS — D563 Thalassemia minor: Secondary | ICD-10-CM | POA: Insufficient documentation

## 2021-05-22 DIAGNOSIS — Z419 Encounter for procedure for purposes other than remedying health state, unspecified: Secondary | ICD-10-CM | POA: Diagnosis not present

## 2021-05-24 ENCOUNTER — Ambulatory Visit (INDEPENDENT_AMBULATORY_CARE_PROVIDER_SITE_OTHER): Payer: Medicaid Other | Admitting: Obstetrics & Gynecology

## 2021-05-24 ENCOUNTER — Encounter: Payer: Self-pay | Admitting: Obstetrics & Gynecology

## 2021-05-24 VITALS — BP 116/70 | HR 92 | Wt 155.0 lb

## 2021-05-24 DIAGNOSIS — Z8759 Personal history of other complications of pregnancy, childbirth and the puerperium: Secondary | ICD-10-CM

## 2021-05-24 DIAGNOSIS — O99013 Anemia complicating pregnancy, third trimester: Secondary | ICD-10-CM

## 2021-05-24 DIAGNOSIS — Z98891 History of uterine scar from previous surgery: Secondary | ICD-10-CM

## 2021-05-24 DIAGNOSIS — Z23 Encounter for immunization: Secondary | ICD-10-CM | POA: Diagnosis not present

## 2021-05-24 DIAGNOSIS — Z3A28 28 weeks gestation of pregnancy: Secondary | ICD-10-CM

## 2021-05-24 DIAGNOSIS — Z3480 Encounter for supervision of other normal pregnancy, unspecified trimester: Secondary | ICD-10-CM

## 2021-05-24 DIAGNOSIS — Z8619 Personal history of other infectious and parasitic diseases: Secondary | ICD-10-CM

## 2021-05-24 DIAGNOSIS — Z8781 Personal history of (healed) traumatic fracture: Secondary | ICD-10-CM | POA: Insufficient documentation

## 2021-05-24 HISTORY — DX: Personal history of other infectious and parasitic diseases: Z86.19
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
## 2021-05-25 ENCOUNTER — Encounter: Payer: Self-pay | Admitting: Obstetrics & Gynecology

## 2021-05-25 LAB — 2HR GTT W 1 HR, CARPENTER, 75 G
Glucose, 1 Hr, Gest: 103 mg/dL (ref 65–179)
Glucose, 2 Hr, Gest: 96 mg/dL (ref 65–152)
Glucose, Fasting, Gest: 83 mg/dL (ref 65–91)

## 2021-05-25 LAB — CBC
HCT: 33.1 % — ABNORMAL LOW (ref 35.0–45.0)
Hemoglobin: 10.7 g/dL — ABNORMAL LOW (ref 11.7–15.5)
MCH: 28 pg (ref 27.0–33.0)
MCHC: 32.3 g/dL (ref 32.0–36.0)
MCV: 86.6 fL (ref 80.0–100.0)
MPV: 10.7 fL (ref 7.5–12.5)
Platelets: 239 10*3/uL (ref 140–400)
RBC: 3.82 10*6/uL (ref 3.80–5.10)
RDW: 12.8 % (ref 11.0–15.0)
WBC: 11.4 10*3/uL — ABNORMAL HIGH (ref 3.8–10.8)

## 2021-05-25 LAB — HIV ANTIBODY (ROUTINE TESTING W REFLEX): HIV 1&2 Ab, 4th Generation: NONREACTIVE

## 2021-05-25 LAB — RPR: RPR Ser Ql: NONREACTIVE

## 2021-05-25 MED ORDER — FERROUS GLUCONATE 324 (38 FE) MG PO TABS: 324.0000 mg | ORAL_TABLET | Freq: Every day | ORAL | 3 refills | Status: DC

## 2021-05-25 NOTE — Addendum Note (Signed)
Addended by: Jaynie Collins A on: 05/25/2021 08:19 AM ? ? Modules accepted: Orders ? ?

## 2021-06-11 ENCOUNTER — Encounter: Payer: Medicaid Other | Admitting: Certified Nurse Midwife

## 2021-06-14 ENCOUNTER — Encounter: Payer: Self-pay | Admitting: Obstetrics & Gynecology

## 2021-06-14 ENCOUNTER — Ambulatory Visit (INDEPENDENT_AMBULATORY_CARE_PROVIDER_SITE_OTHER): Payer: Medicaid Other | Admitting: Obstetrics & Gynecology

## 2021-06-14 VITALS — BP 106/73 | HR 142 | Wt 161.0 lb

## 2021-06-14 DIAGNOSIS — Z3A31 31 weeks gestation of pregnancy: Secondary | ICD-10-CM

## 2021-06-14 DIAGNOSIS — Z3480 Encounter for supervision of other normal pregnancy, unspecified trimester: Secondary | ICD-10-CM

## 2021-06-14 DIAGNOSIS — O99013 Anemia complicating pregnancy, third trimester: Secondary | ICD-10-CM
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
## 2021-06-21 DIAGNOSIS — Z419 Encounter for procedure for purposes other than remedying health state, unspecified: Secondary | ICD-10-CM | POA: Diagnosis not present

## 2021-07-02 ENCOUNTER — Ambulatory Visit (INDEPENDENT_AMBULATORY_CARE_PROVIDER_SITE_OTHER): Payer: Medicaid Other | Admitting: Certified Nurse Midwife

## 2021-07-02 VITALS — BP 117/75 | HR 92 | Wt 162.0 lb

## 2021-07-02 DIAGNOSIS — Z3A34 34 weeks gestation of pregnancy: Secondary | ICD-10-CM

## 2021-07-02 DIAGNOSIS — Z8619 Personal history of other infectious and parasitic diseases: Secondary | ICD-10-CM

## 2021-07-02 DIAGNOSIS — Z3480 Encounter for supervision of other normal pregnancy, unspecified trimester: Secondary | ICD-10-CM

## 2021-07-02 DIAGNOSIS — Z98891 History of uterine scar from previous surgery: Secondary | ICD-10-CM

## 2021-07-02 MED ORDER — VALACYCLOVIR HCL 500 MG PO TABS
500.0000 mg | ORAL_TABLET | Freq: Two times a day (BID) | ORAL | 1 refills | Status: DC
Start: 2021-07-02 — End: 2021-07-26
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
## 2021-07-16 ENCOUNTER — Other Ambulatory Visit (HOSPITAL_COMMUNITY)
Admission: RE | Admit: 2021-07-16 | Discharge: 2021-07-16 | Disposition: A | Discharge status: Home / Self Care | Payer: Medicaid Other | Source: Ambulatory Visit | Attending: Obstetrics and Gynecology | Admitting: Obstetrics and Gynecology

## 2021-07-16 ENCOUNTER — Ambulatory Visit (INDEPENDENT_AMBULATORY_CARE_PROVIDER_SITE_OTHER): Payer: Medicaid Other | Admitting: Obstetrics and Gynecology

## 2021-07-16 VITALS — BP 119/73 | HR 103 | Wt 163.0 lb

## 2021-07-16 DIAGNOSIS — Z3480 Encounter for supervision of other normal pregnancy, unspecified trimester: Secondary | ICD-10-CM | POA: Insufficient documentation

## 2021-07-16 DIAGNOSIS — Z3483 Encounter for supervision of other normal pregnancy, third trimester: Secondary | ICD-10-CM | POA: Diagnosis not present

## 2021-07-16 DIAGNOSIS — Z3A36 36 weeks gestation of pregnancy: Secondary | ICD-10-CM | POA: Diagnosis not present
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
## 2021-07-19 LAB — CULTURE, BETA STREP (GROUP B ONLY)
MICRO NUMBER:: 13450775
SPECIMEN QUALITY:: ADEQUATE

## 2021-07-20 ENCOUNTER — Encounter: Payer: Self-pay | Admitting: Obstetrics and Gynecology

## 2021-07-20 DIAGNOSIS — O99820 Streptococcus B carrier state complicating pregnancy: Secondary | ICD-10-CM | POA: Insufficient documentation

## 2021-07-21 LAB — CERVICOVAGINAL ANCILLARY ONLY
Chlamydia: NEGATIVE
Comment: NEGATIVE
Comment: NORMAL
Neisseria Gonorrhea: NEGATIVE

## 2021-07-22 ENCOUNTER — Encounter (HOSPITAL_COMMUNITY): Payer: Self-pay

## 2021-07-22 ENCOUNTER — Telehealth (HOSPITAL_COMMUNITY): Payer: Self-pay | Admitting: *Deleted

## 2021-07-22 DIAGNOSIS — Z419 Encounter for procedure for purposes other than remedying health state, unspecified: Secondary | ICD-10-CM | POA: Diagnosis not present

## 2021-07-22 NOTE — Telephone Encounter (Signed)
Preadmission screen  

## 2021-07-22 NOTE — Patient Instructions (Signed)
Shannon Hogan  07/22/2021   Your procedure is scheduled on:  08/05/2021  Arrive at 0730 at Entrance C on [employer] at [facility]  and [employer]. You are invited to use the FREE valet parking or use the Visitor's parking deck.  Pick up the phone at the desk and dial [phone].  Call this number if you have problems the morning of surgery: [phone]  Remember:   Do not eat food:(After Midnight) Desps de medianoche.  Do not drink clear liquids: (After Midnight) Desps de medianoche.  Take these medicines the morning of surgery with A SIP OF WATER:  none   Do not wear jewelry, make-up or nail polish.  Do not wear lotions, powders, or perfumes. Do not wear deodorant.  Do not shave 48 hours prior to surgery.  Do not bring valuables to the hospital.  [facility] is not   responsible for any belongings or valuables brought to the hospital.  Contacts, dentures or bridgework may not be worn into surgery.  Leave suitcase in the car. After surgery it may be brought to your room.  For patients admitted to the hospital, checkout time is 11:00 AM the day of              discharge.      Please read over the following fact sheets that you were given:     Preparing for Surgery

## 2021-07-23 ENCOUNTER — Ambulatory Visit (INDEPENDENT_AMBULATORY_CARE_PROVIDER_SITE_OTHER): Payer: Medicaid Other | Admitting: Obstetrics and Gynecology

## 2021-07-23 ENCOUNTER — Encounter (HOSPITAL_COMMUNITY): Payer: Self-pay

## 2021-07-23 VITALS — BP 108/71 | HR 92 | Wt 164.0 lb

## 2021-07-23 DIAGNOSIS — O99820 Streptococcus B carrier state complicating pregnancy: Secondary | ICD-10-CM

## 2021-07-23 DIAGNOSIS — Z3480 Encounter for supervision of other normal pregnancy, unspecified trimester: Secondary | ICD-10-CM
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
## 2021-07-26 ENCOUNTER — Ambulatory Visit (HOSPITAL_BASED_OUTPATIENT_CLINIC_OR_DEPARTMENT_OTHER): Payer: Medicaid Other

## 2021-07-26 ENCOUNTER — Ambulatory Visit: Payer: Medicaid Other | Attending: Obstetrics and Gynecology | Admitting: *Deleted

## 2021-07-26 VITALS — BP 111/67 | HR 95

## 2021-07-26 DIAGNOSIS — D649 Anemia, unspecified: Secondary | ICD-10-CM | POA: Insufficient documentation

## 2021-07-26 DIAGNOSIS — O34219 Maternal care for unspecified type scar from previous cesarean delivery: Secondary | ICD-10-CM | POA: Diagnosis not present

## 2021-07-26 DIAGNOSIS — Z3480 Encounter for supervision of other normal pregnancy, unspecified trimester: Secondary | ICD-10-CM | POA: Diagnosis not present

## 2021-07-26 DIAGNOSIS — Z3A37 37 weeks gestation of pregnancy: Secondary | ICD-10-CM | POA: Diagnosis not present

## 2021-07-26 DIAGNOSIS — O26843 Uterine size-date discrepancy, third trimester: Secondary | ICD-10-CM | POA: Diagnosis not present

## 2021-07-26 DIAGNOSIS — Z148 Genetic carrier of other disease: Secondary | ICD-10-CM | POA: Insufficient documentation

## 2021-07-26 DIAGNOSIS — O09293 Supervision of pregnancy with other poor reproductive or obstetric history, third trimester: Secondary | ICD-10-CM | POA: Diagnosis not present

## 2021-07-26 DIAGNOSIS — O99013 Anemia complicating pregnancy, third trimester: Secondary | ICD-10-CM | POA: Diagnosis not present

## 2021-07-26 DIAGNOSIS — O99820 Streptococcus B carrier state complicating pregnancy: Secondary | ICD-10-CM

## 2021-07-27 ENCOUNTER — Ambulatory Visit: Payer: Self-pay

## 2021-07-27 ENCOUNTER — Ambulatory Visit: Payer: Medicaid Other

## 2021-07-30 ENCOUNTER — Ambulatory Visit (INDEPENDENT_AMBULATORY_CARE_PROVIDER_SITE_OTHER): Payer: Medicaid Other | Admitting: Certified Nurse Midwife

## 2021-07-30 VITALS — BP 112/73 | HR 102 | Wt 167.0 lb

## 2021-07-30 DIAGNOSIS — Z3A38 38 weeks gestation of pregnancy: Secondary | ICD-10-CM

## 2021-07-30 DIAGNOSIS — Z3480 Encounter for supervision of other normal pregnancy, unspecified trimester: Secondary | ICD-10-CM

## 2021-07-30 DIAGNOSIS — Z98891 History of uterine scar from previous surgery: Secondary | ICD-10-CM

## 2021-07-30 DIAGNOSIS — Z8619 Personal history of other infectious and parasitic diseases: Secondary | ICD-10-CM
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
## 2021-08-03 ENCOUNTER — Encounter (HOSPITAL_COMMUNITY)
Admission: RE | Admit: 2021-08-03 | Discharge: 2021-08-03 | Disposition: A | Discharge status: Home / Self Care | Payer: Medicaid Other | Source: Ambulatory Visit | Attending: Obstetrics & Gynecology | Admitting: Obstetrics & Gynecology

## 2021-08-03 DIAGNOSIS — Z01812 Encounter for preprocedural laboratory examination: Secondary | ICD-10-CM | POA: Insufficient documentation

## 2021-08-03 DIAGNOSIS — Z3480 Encounter for supervision of other normal pregnancy, unspecified trimester: Secondary | ICD-10-CM

## 2021-08-03 LAB — CBC
HCT: 33.3 % — ABNORMAL LOW (ref 36.0–46.0)
Hemoglobin: 11 g/dL — ABNORMAL LOW (ref 12.0–15.0)
MCH: 27.6 pg (ref 26.0–34.0)
MCHC: 33 g/dL (ref 30.0–36.0)
MCV: 83.5 fL (ref 80.0–100.0)
Platelets: 219 10*3/uL (ref 150–400)
RBC: 3.99 MIL/uL (ref 3.87–5.11)
RDW: 12.6 % (ref 11.5–15.5)
WBC: 11.3 10*3/uL — ABNORMAL HIGH (ref 4.0–10.5)
nRBC: 0 % (ref 0.0–0.2)

## 2021-08-03 LAB — TYPE AND SCREEN
ABO/RH(D): A POS
Antibody Screen: NEGATIVE

## 2021-08-03 LAB — RPR: RPR Ser Ql: NONREACTIVE
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
## 2021-08-05 ENCOUNTER — Encounter (HOSPITAL_COMMUNITY): Payer: Self-pay | Admitting: Obstetrics & Gynecology

## 2021-08-05 ENCOUNTER — Inpatient Hospital Stay (HOSPITAL_COMMUNITY): Payer: Medicaid Other | Admitting: Anesthesiology

## 2021-08-05 ENCOUNTER — Other Ambulatory Visit: Payer: Self-pay

## 2021-08-05 ENCOUNTER — Encounter (HOSPITAL_COMMUNITY)
Admission: AD | Disposition: A | Discharge status: Home / Self Care | Payer: Self-pay | Source: Home / Self Care | Attending: Obstetrics & Gynecology

## 2021-08-05 ENCOUNTER — Inpatient Hospital Stay (HOSPITAL_COMMUNITY)
Admission: AD | Admit: 2021-08-05 | Discharge: 2021-08-07 | DRG: 787 | Disposition: A | Discharge status: Home / Self Care | Payer: Medicaid Other | Attending: Obstetrics & Gynecology | Admitting: Obstetrics & Gynecology

## 2021-08-05 DIAGNOSIS — O99013 Anemia complicating pregnancy, third trimester: Secondary | ICD-10-CM

## 2021-08-05 DIAGNOSIS — Z8759 Personal history of other complications of pregnancy, childbirth and the puerperium: Secondary | ICD-10-CM

## 2021-08-05 DIAGNOSIS — O34219 Maternal care for unspecified type scar from previous cesarean delivery: Secondary | ICD-10-CM | POA: Diagnosis not present

## 2021-08-05 DIAGNOSIS — Z30014 Encounter for initial prescription of intrauterine contraceptive device: Secondary | ICD-10-CM | POA: Diagnosis not present

## 2021-08-05 DIAGNOSIS — O9902 Anemia complicating childbirth: Secondary | ICD-10-CM | POA: Diagnosis not present

## 2021-08-05 DIAGNOSIS — O34211 Maternal care for low transverse scar from previous cesarean delivery: Secondary | ICD-10-CM

## 2021-08-05 DIAGNOSIS — O99824 Streptococcus B carrier state complicating childbirth: Secondary | ICD-10-CM | POA: Diagnosis not present

## 2021-08-05 DIAGNOSIS — Z8619 Personal history of other infectious and parasitic diseases: Secondary | ICD-10-CM | POA: Diagnosis present

## 2021-08-05 DIAGNOSIS — Z7982 Long term (current) use of aspirin: Secondary | ICD-10-CM | POA: Diagnosis not present

## 2021-08-05 DIAGNOSIS — D62 Acute posthemorrhagic anemia: Secondary | ICD-10-CM | POA: Diagnosis not present

## 2021-08-05 DIAGNOSIS — Z98891 History of uterine scar from previous surgery: Secondary | ICD-10-CM

## 2021-08-05 DIAGNOSIS — Z30430 Encounter for insertion of intrauterine contraceptive device: Secondary | ICD-10-CM | POA: Diagnosis not present

## 2021-08-05 DIAGNOSIS — Z3490 Encounter for supervision of normal pregnancy, unspecified, unspecified trimester: Secondary | ICD-10-CM | POA: Diagnosis not present

## 2021-08-05 DIAGNOSIS — D649 Anemia, unspecified: Secondary | ICD-10-CM | POA: Diagnosis not present

## 2021-08-05 DIAGNOSIS — O164 Unspecified maternal hypertension, complicating childbirth: Secondary | ICD-10-CM

## 2021-08-05 DIAGNOSIS — Z3480 Encounter for supervision of other normal pregnancy, unspecified trimester: Secondary | ICD-10-CM

## 2021-08-05 DIAGNOSIS — O99820 Streptococcus B carrier state complicating pregnancy: Secondary | ICD-10-CM

## 2021-08-05 DIAGNOSIS — Z3A39 39 weeks gestation of pregnancy: Secondary | ICD-10-CM

## 2021-08-05 DIAGNOSIS — Z3A00 Weeks of gestation of pregnancy not specified: Secondary | ICD-10-CM | POA: Diagnosis not present

## 2021-08-05 LAB — RAPID HIV SCREEN (HIV 1/2 AB+AG)
HIV 1/2 Antibodies: NONREACTIVE
HIV-1 P24 Antigen - HIV24: NONREACTIVE

## 2021-08-05 SURGERY — Surgical Case
Anesthesia: Spinal

## 2021-08-05 MED ORDER — SODIUM CHLORIDE 0.9 % IR SOLN
Status: DC | PRN
  Administered 2021-08-05: 1

## 2021-08-05 MED ORDER — PHENYLEPHRINE HCL-NACL 20-0.9 MG/250ML-% IV SOLN
INTRAVENOUS | Status: DC | PRN
  Administered 2021-08-05: 60 ug/min via INTRAVENOUS

## 2021-08-05 MED ORDER — FENTANYL CITRATE (PF) 100 MCG/2ML IJ SOLN
INTRAMUSCULAR | Status: DC | PRN
  Administered 2021-08-05: 15 ug via INTRATHECAL

## 2021-08-05 MED ORDER — PHENYLEPHRINE HCL-NACL 20-0.9 MG/250ML-% IV SOLN
INTRAVENOUS | Status: AC
  Filled 2021-08-05: qty 250

## 2021-08-05 MED ORDER — PHENYLEPHRINE HCL (PRESSORS) 10 MG/ML IV SOLN
INTRAVENOUS | Status: DC | PRN
  Administered 2021-08-05 (×2): 80 ug via INTRAVENOUS

## 2021-08-05 MED ORDER — ACETAMINOPHEN 325 MG PO TABS
650.0000 mg | ORAL_TABLET | ORAL | Status: DC | PRN
  Administered 2021-08-06 – 2021-08-07 (×4): 650 mg via ORAL
  Filled 2021-08-05 (×4): qty 2

## 2021-08-05 MED ORDER — SCOPOLAMINE 1 MG/3DAYS TD PT72
MEDICATED_PATCH | TRANSDERMAL | Status: DC | PRN
  Administered 2021-08-05: 1 via TRANSDERMAL

## 2021-08-05 MED ORDER — PHENYLEPHRINE 80 MCG/ML (10ML) SYRINGE FOR IV PUSH (FOR BLOOD PRESSURE SUPPORT)
PREFILLED_SYRINGE | INTRAVENOUS | Status: AC
  Filled 2021-08-05: qty 10

## 2021-08-05 MED ORDER — DIPHENHYDRAMINE HCL 50 MG/ML IJ SOLN
INTRAMUSCULAR | Status: AC
  Filled 2021-08-05: qty 1

## 2021-08-05 MED ORDER — ACETAMINOPHEN 160 MG/5ML PO SOLN: 325.0000 mg | ORAL | Status: DC | PRN

## 2021-08-05 MED ORDER — TRANEXAMIC ACID-NACL 1000-0.7 MG/100ML-% IV SOLN
INTRAVENOUS | Status: AC
  Filled 2021-08-05: qty 100

## 2021-08-05 MED ORDER — LACTATED RINGERS IV SOLN: INTRAVENOUS | Status: DC

## 2021-08-05 MED ORDER — ONDANSETRON HCL 4 MG/2ML IJ SOLN
INTRAMUSCULAR | Status: AC
  Filled 2021-08-05: qty 2

## 2021-08-05 MED ORDER — BUPIVACAINE IN DEXTROSE 0.75-8.25 % IT SOLN
INTRATHECAL | Status: DC | PRN
  Administered 2021-08-05: 1.4 mL via INTRATHECAL

## 2021-08-05 MED ORDER — SENNOSIDES-DOCUSATE SODIUM 8.6-50 MG PO TABS
2.0000 | ORAL_TABLET | Freq: Every day | ORAL | Status: DC
  Administered 2021-08-07: 2 via ORAL
  Filled 2021-08-05 (×2): qty 2

## 2021-08-05 MED ORDER — OXYTOCIN-SODIUM CHLORIDE 30-0.9 UT/500ML-% IV SOLN
INTRAVENOUS | Status: DC | PRN
  Administered 2021-08-05: 250 mL via INTRAVENOUS

## 2021-08-05 MED ORDER — IBUPROFEN 600 MG PO TABS
600.0000 mg | ORAL_TABLET | Freq: Four times a day (QID) | ORAL | Status: DC
  Administered 2021-08-06 – 2021-08-07 (×4): 600 mg via ORAL
  Filled 2021-08-05 (×5): qty 1

## 2021-08-05 MED ORDER — DIPHENHYDRAMINE HCL 50 MG/ML IJ SOLN
12.5000 mg | Freq: Once | INTRAMUSCULAR | Status: AC
  Administered 2021-08-05: 12.5 mg via INTRAVENOUS

## 2021-08-05 MED ORDER — COCONUT OIL OIL
TOPICAL_OIL | Status: DC | PRN
Start: 2021-08-05 — End: 2021-08-07

## 2021-08-05 MED ORDER — MORPHINE SULFATE (PF) 0.5 MG/ML IJ SOLN
INTRAMUSCULAR | Status: DC | PRN
  Administered 2021-08-05: 150 ug via INTRATHECAL

## 2021-08-05 MED ORDER — METOCLOPRAMIDE HCL 5 MG/ML IJ SOLN
INTRAMUSCULAR | Status: AC
  Filled 2021-08-05: qty 2

## 2021-08-05 MED ORDER — OXYCODONE HCL 5 MG PO TABS: 5.0000 mg | ORAL_TABLET | Freq: Once | ORAL | Status: DC | PRN

## 2021-08-05 MED ORDER — KETOROLAC TROMETHAMINE 30 MG/ML IJ SOLN
30.0000 mg | Freq: Four times a day (QID) | INTRAMUSCULAR | Status: AC
  Administered 2021-08-05 – 2021-08-06 (×3): 30 mg via INTRAVENOUS
  Filled 2021-08-05 (×3): qty 1

## 2021-08-05 MED ORDER — MEPERIDINE HCL 25 MG/ML IJ SOLN: 6.2500 mg | INTRAMUSCULAR | Status: DC | PRN

## 2021-08-05 MED ORDER — FENTANYL CITRATE (PF) 100 MCG/2ML IJ SOLN
INTRAMUSCULAR | Status: AC
  Filled 2021-08-05: qty 2

## 2021-08-05 MED ORDER — MORPHINE SULFATE (PF) 0.5 MG/ML IJ SOLN
INTRAMUSCULAR | Status: AC
  Filled 2021-08-05: qty 10

## 2021-08-05 MED ORDER — OXYTOCIN-SODIUM CHLORIDE 30-0.9 UT/500ML-% IV SOLN
INTRAVENOUS | Status: AC
  Filled 2021-08-05: qty 500

## 2021-08-05 MED ORDER — DIPHENHYDRAMINE HCL 25 MG PO CAPS: 25.0000 mg | ORAL_CAPSULE | Freq: Four times a day (QID) | ORAL | Status: DC | PRN

## 2021-08-05 MED ORDER — ACETAMINOPHEN 325 MG PO TABS: 325.0000 mg | ORAL_TABLET | ORAL | Status: DC | PRN

## 2021-08-05 MED ORDER — PRENATAL MULTIVITAMIN CH
1.0000 | ORAL_TABLET | Freq: Every day | ORAL | Status: DC
  Administered 2021-08-06: 1 via ORAL
  Filled 2021-08-05 (×2): qty 1

## 2021-08-05 MED ORDER — LEVONORGESTREL 20 MCG/DAY IU IUD
INTRAUTERINE_SYSTEM | INTRAUTERINE | Status: AC
  Filled 2021-08-05: qty 1

## 2021-08-05 MED ORDER — DEXAMETHASONE SODIUM PHOSPHATE 4 MG/ML IJ SOLN
INTRAMUSCULAR | Status: DC | PRN
  Administered 2021-08-05: 8 mg via INTRAVENOUS

## 2021-08-05 MED ORDER — TETANUS-DIPHTH-ACELL PERTUSSIS 5-2.5-18.5 LF-MCG/0.5 IM SUSY: 0.5000 mL | PREFILLED_SYRINGE | Freq: Once | INTRAMUSCULAR | Status: DC

## 2021-08-05 MED ORDER — ACETAMINOPHEN 10 MG/ML IV SOLN
INTRAVENOUS | Status: DC | PRN
  Administered 2021-08-05: 1000 mg via INTRAVENOUS

## 2021-08-05 MED ORDER — FENTANYL CITRATE (PF) 100 MCG/2ML IJ SOLN: 25.0000 ug | INTRAMUSCULAR | Status: DC | PRN

## 2021-08-05 MED ORDER — METOCLOPRAMIDE HCL 5 MG/ML IJ SOLN
INTRAMUSCULAR | Status: DC | PRN
  Administered 2021-08-05: 10 mg via INTRAVENOUS

## 2021-08-05 MED ORDER — OXYTOCIN-SODIUM CHLORIDE 30-0.9 UT/500ML-% IV SOLN
2.5000 [IU]/h | INTRAVENOUS | Status: AC
  Administered 2021-08-05: 2.5 [IU]/h via INTRAVENOUS

## 2021-08-05 MED ORDER — STERILE WATER FOR IRRIGATION IR SOLN
Status: DC | PRN
  Administered 2021-08-05: 1000 mL

## 2021-08-05 MED ORDER — DEXAMETHASONE SODIUM PHOSPHATE 4 MG/ML IJ SOLN
INTRAMUSCULAR | Status: AC
  Filled 2021-08-05: qty 2

## 2021-08-05 MED ORDER — ONDANSETRON HCL 4 MG/2ML IJ SOLN
INTRAMUSCULAR | Status: DC | PRN
  Administered 2021-08-05: 4 mg via INTRAVENOUS

## 2021-08-05 MED ORDER — TRANEXAMIC ACID-NACL 1000-0.7 MG/100ML-% IV SOLN
INTRAVENOUS | Status: DC | PRN
  Administered 2021-08-05: 1000 mg via INTRAVENOUS

## 2021-08-05 MED ORDER — ENOXAPARIN SODIUM 40 MG/0.4ML IJ SOSY
40.0000 mg | PREFILLED_SYRINGE | INTRAMUSCULAR | Status: DC
  Administered 2021-08-05 – 2021-08-06 (×2): 40 mg via SUBCUTANEOUS
  Filled 2021-08-05 (×2): qty 0.4

## 2021-08-05 MED ORDER — DIBUCAINE (PERIANAL) 1 % EX OINT: TOPICAL_OINTMENT | CUTANEOUS | Status: DC | PRN

## 2021-08-05 MED ORDER — SIMETHICONE 80 MG PO CHEW: 80.0000 mg | CHEWABLE_TABLET | ORAL | Status: DC | PRN

## 2021-08-05 MED ORDER — OXYCODONE HCL 5 MG PO TABS
5.0000 mg | ORAL_TABLET | ORAL | Status: DC | PRN
  Administered 2021-08-05 – 2021-08-07 (×7): 10 mg via ORAL
  Filled 2021-08-05 (×7): qty 2

## 2021-08-05 MED ORDER — SIMETHICONE 80 MG PO CHEW
80.0000 mg | CHEWABLE_TABLET | Freq: Three times a day (TID) | ORAL | Status: DC
  Administered 2021-08-05 – 2021-08-07 (×5): 80 mg via ORAL
  Filled 2021-08-05 (×6): qty 1

## 2021-08-05 MED ORDER — WITCH HAZEL-GLYCERIN EX PADS: MEDICATED_PAD | CUTANEOUS | Status: DC | PRN

## 2021-08-05 MED ORDER — POVIDONE-IODINE 10 % EX SWAB
2.0000 "application " | Freq: Once | CUTANEOUS | Status: AC
  Administered 2021-08-05: 2 via TOPICAL

## 2021-08-05 MED ORDER — CEFAZOLIN SODIUM-DEXTROSE 2-4 GM/100ML-% IV SOLN
INTRAVENOUS | Status: AC
  Filled 2021-08-05: qty 100

## 2021-08-05 MED ORDER — MENTHOL 3 MG MT LOZG: 1.0000 | LOZENGE | OROMUCOSAL | Status: DC | PRN

## 2021-08-05 MED ORDER — CEFAZOLIN SODIUM-DEXTROSE 2-4 GM/100ML-% IV SOLN
2.0000 g | INTRAVENOUS | Status: AC
  Administered 2021-08-05: 2 g via INTRAVENOUS

## 2021-08-05 MED ORDER — LEVONORGESTREL 20 MCG/DAY IU IUD
1.0000 | INTRAUTERINE_SYSTEM | Freq: Once | INTRAUTERINE | Status: AC
  Administered 2021-08-05: 1 via INTRAUTERINE

## 2021-08-05 MED ORDER — OXYCODONE HCL 5 MG/5ML PO SOLN: 5.0000 mg | Freq: Once | ORAL | Status: DC | PRN

## 2021-08-05 MED ORDER — MEASLES, MUMPS & RUBELLA VAC IJ SOLR: 0.5000 mL | Freq: Once | INTRAMUSCULAR | Status: DC

## 2021-08-05 MED ORDER — ONDANSETRON HCL 4 MG/2ML IJ SOLN: 4.0000 mg | Freq: Once | INTRAMUSCULAR | Status: DC | PRN

## 2021-08-05 SURGICAL SUPPLY — 38 items
BENZOIN TINCTURE PRP APPL 2/3 (GAUZE/BANDAGES/DRESSINGS) ×2 IMPLANT
CHLORAPREP W/TINT 26ML (MISCELLANEOUS) ×4 IMPLANT
CLAMP CORD UMBIL (MISCELLANEOUS) ×2 IMPLANT
CLOTH BEACON ORANGE TIMEOUT ST (SAFETY) ×2 IMPLANT
DERMABOND ADVANCED (GAUZE/BANDAGES/DRESSINGS)
DERMABOND ADVANCED .7 DNX12 (GAUZE/BANDAGES/DRESSINGS) IMPLANT
DRSG OPSITE POSTOP 4X10 (GAUZE/BANDAGES/DRESSINGS) ×2 IMPLANT
ELECT REM PT RETURN 9FT ADLT (ELECTROSURGICAL) ×2
ELECTRODE REM PT RTRN 9FT ADLT (ELECTROSURGICAL) ×1 IMPLANT
EXTRACTOR VACUUM KIWI (MISCELLANEOUS) IMPLANT
GLOVE BIOGEL PI IND STRL 7.0 (GLOVE) ×3 IMPLANT
GLOVE BIOGEL PI INDICATOR 7.0 (GLOVE) ×3
GLOVE ECLIPSE 6.5 STRL STRAW (GLOVE) ×2 IMPLANT
GOWN STRL REUS W/TWL LRG LVL3 (GOWN DISPOSABLE) ×6 IMPLANT
HEMOSTAT ARISTA ABSORB 3G PWDR (HEMOSTASIS) ×1 IMPLANT
KIT ABG SYR 3ML LUER SLIP (SYRINGE) IMPLANT
NDL HYPO 25X5/8 SAFETYGLIDE (NEEDLE) IMPLANT
NEEDLE HYPO 25X5/8 SAFETYGLIDE (NEEDLE) IMPLANT
NS IRRIG 1000ML POUR BTL (IV SOLUTION) ×2 IMPLANT
PACK C SECTION WH (CUSTOM PROCEDURE TRAY) ×2 IMPLANT
PAD ABD 7.5X8 STRL (GAUZE/BANDAGES/DRESSINGS) ×2 IMPLANT
PAD ABD 8X10 STRL (GAUZE/BANDAGES/DRESSINGS) ×1 IMPLANT
PAD OB MATERNITY 4.3X12.25 (PERSONAL CARE ITEMS) ×2 IMPLANT
RTRCTR C-SECT PINK 25CM LRG (MISCELLANEOUS) ×2 IMPLANT
SPONGE GAUZE 4X4 STERILE 39 (GAUZE/BANDAGES/DRESSINGS) ×1 IMPLANT
STRIP CLOSURE SKIN 1/2X4 (GAUZE/BANDAGES/DRESSINGS) ×2 IMPLANT
SUT PLAIN 0 NONE (SUTURE) IMPLANT
SUT PLAIN 2 0 XLH (SUTURE) IMPLANT
SUT VIC AB 0 CT1 27 (SUTURE) ×2
SUT VIC AB 0 CT1 27XBRD ANBCTR (SUTURE) ×2 IMPLANT
SUT VIC AB 0 CTX 36 (SUTURE) ×3
SUT VIC AB 0 CTX36XBRD ANBCTRL (SUTURE) ×3 IMPLANT
SUT VIC AB 2-0 CT1 27 (SUTURE) ×1
SUT VIC AB 2-0 CT1 TAPERPNT 27 (SUTURE) ×1 IMPLANT
SUT VIC AB 4-0 KS 27 (SUTURE) ×2 IMPLANT
TOWEL OR 17X24 6PK STRL BLUE (TOWEL DISPOSABLE) ×2 IMPLANT
TRAY FOLEY W/BAG SLVR 14FR LF (SET/KITS/TRAYS/PACK) IMPLANT
WATER STERILE IRR 1000ML POUR (IV SOLUTION) ×2 IMPLANT

## 2021-08-05 NOTE — Lactation Note (Signed)
This note was copied from a baby's chart. Lactation Consultation Note Mom called for questions. LC answered mom's questions. Assisted mom in positioning and chin tug for deeper latch. Mom denies  painful latch.  Body alignment, positioning and support reviewed. Praised mom for BF well. Encouraged mom to rest while baby is resting d/t probable cluster feeding tomorrow. Mom had hot packs in the bed and mentioned them  stating she puts them on her breast. LC discussed to use them only for let down not to leave on breast long will cause engorgement. Mom stated OK. Mom shared her engorgement stories w/her 1st child and clogged ducts stating that is why she stopped BF at 1 month. Mom stated lessons learned. Encouraged mom to ask questions or for assistance as needed.  Patient Name: Shannon Hogan [id number] Date: 08/05/2021 Reason for consult: Mother's request;Term Age:43 hours  Maternal Data Does the patient have breastfeeding experience prior to this delivery?: Yes How long did the patient breastfeed?: 1 month  Feeding    LATCH Score Latch: Grasps breast easily, tongue down, lips flanged, rhythmical sucking.  Audible Swallowing: None  Type of Nipple: Everted at rest and after stimulation (short shaft)  Comfort (Breast/Nipple): Soft / non-tender  Hold (Positioning): Assistance needed to correctly position infant at breast and maintain latch.  LATCH Score: 7   Lactation Tools Discussed/Used    Interventions Interventions: Breast feeding basics reviewed;Assisted with latch;Breast compression;Adjust position;Support pillows;Position options  Discharge    Consult Status Consult Status: Follow-up Date: 08/06/21 Follow-up type: In-patient    Charyl Dancer 08/05/2021, 9:19 PM

## 2021-08-05 NOTE — Anesthesia Postprocedure Evaluation (Signed)
Anesthesia Post Note  Patient: Asra Gambrel  Procedure(s) Performed: CESAREAN SECTION AND IUD INSERT     Patient location during evaluation: PACU Anesthesia Type: Spinal Level of consciousness: oriented and awake and alert Pain management: pain level controlled Vital Signs Assessment: post-procedure vital signs reviewed and stable Respiratory status: spontaneous breathing, respiratory function stable and patient connected to nasal cannula oxygen Cardiovascular status: blood pressure returned to baseline and stable Postop Assessment: no headache, no backache and no apparent nausea or vomiting Anesthetic complications: no   No notable events documented.  Last Vitals:  Vitals:   08/05/21 0800 08/05/21 1045  BP: 122/78 117/80  Pulse: 95 87  Resp:  19  Temp: 36.7 C 36.6 C  SpO2: 100% 100%    Last Pain:  Vitals:   08/05/21 1045  TempSrc:   PainSc: 0-No pain   Pain Goal: Patients Stated Pain Goal: 5 (08/05/21 1045)  LLE Motor Response: No movement due to regional block (08/05/21 1045) LLE Sensation: Tingling (08/05/21 1045) RLE Motor Response: No movement due to regional block (08/05/21 1045) RLE Sensation: Tingling (08/05/21 1045) L Sensory Level: T12-Inguinal (groin) region (08/05/21 1045) R Sensory Level: T12-Inguinal (groin) region (08/05/21 1045) Epidural/Spinal Function Cutaneous sensation: Tingles (08/05/21 1045), Patient able to flex knees: No (08/05/21 1045), Patient able to lift hips off bed: No (08/05/21 1045), Back pain beyond tenderness at insertion site: No (08/05/21 1045), Progressively worsening motor and/or sensory loss: No (08/05/21 1045), Bowel and/or bladder incontinence post epidural: No (08/05/21 1045)  Dariyah Garduno

## 2021-08-05 NOTE — Op Note (Signed)
Operative Note   Patient: Shannon Hogan  Date of Procedure: 08/05/2021  Procedure: Repeat Low Transverse Cesarean   Indications:  elective repeat  Pre-operative Diagnosis: RCS Encounter for Contraception.   Post-operative Diagnosis: Same and IUD insertion: Mirena  TOLAC Candidate: No  Surgeon: Surgeon(s) and Role:    * Myna Hidalgo, DO - Primary    * Warner Mccreedy, MD - Assisting  Assistants: Warner Mccreedy, MD  An experienced assistant was required given the standard of surgical care given the complexity of the case.  This assistant was needed for exposure, dissection, suctioning, retraction, instrument exchange, assisting with delivery with administration of fundal pressure, and for overall help during the procedure.   Anesthesia: spinal  Anesthesiologist: Dr. Tacy Dura  Antibiotics: Cefazolin   Estimated Blood Loss: 238 ml   Total IV Fluids: 1700 ml  Urine Output:  200 cc OF clear urine  Specimens: placenta to L&D   Complications: no complications   Indications: Shannon Hogan is a 21 y.o. G2P1001 with an IUP [redacted]w[redacted]d presenting for scheduled cesarean secondary to the indications listed above.    Findings: Viable infant in cephalic presentation, no nuchal cord present. Apgars 8 , 9 , . Weight 3330 g . Clear amniotic fluid. Normal placenta, three vessel cord. Normal uterus, Normal bilateral fallopian tubes, Normal bilateral ovaries.  Procedure Details: A Time Out was held and the above information confirmed. The patient received intravenous antibiotics and had sequential compression devices applied to her lower extremities preoperatively. The patient was taken back to the operative suite where spinal anesthesia was administered. After induction of anesthesia, the patient was draped and prepped in the usual sterile manner and placed in a dorsal supine position with a leftward tilt. A low transverse skin incision was made with scalpel and carried down through the subcutaneous  tissue to the fascia. Fascial incision was made and extended transversely. The fascia was separated from the underlying rectus tissue superiorly and inferiorly. The rectus muscles were separated in the midline bluntly and the peritoneum was entered bluntly. An Alexis retractor was placed to aid in visualization of the uterus. A bladder flap was not developed. A low transverse uterine incision was made. The infant was successfully delivered from cephalic presentation with the assistance of a vaccum, the umbilical cord was clamped after 1 minute. Cord ph was not sent, and cord blood was obtained for evaluation. The placenta was removed Intact and appeared normal.   A Mirena IUD was then removed from it's packaging in a sterile manner and the strings were trimmed to approximately 10 cm. The IUD was placed manually at the uterine fundus and the strings were passed through the cervical os with a Kelly clamp.   The uterine incision was closed with running locked sutures of 0-Vicryl, and then two figure of eight stitches were done (one at each edge of the hysterotomy) in order to achieve hemostasis. At that time excellent hemostasis noted. In addition, in an abundance of caution Arrista was also placed throughout site of uterine incision. Overall, excellent hemostasis was noted. The abdomen and the pelvis were cleared of all clot and debris and the Jon Gills was removed. Hemostasis was confirmed on all surfaces.  The peritoneum was reapproximated using 2-0 vicryl . The fascia was then closed using 0 Vicryl in a running fashion. The subcutaneous layer was reapproximated with plain gut and the skin was closed with a 4-0 vicryl subcuticular stitch. The patient tolerated the procedure well. Sponge, lap, instrument and needle counts were correct x  2. She was taken to the recovery room in stable condition.  Disposition: PACU - hemodynamically stable.    Signed: Warner Mccreedy, MD, MPH [facility])

## 2021-08-05 NOTE — Anesthesia Procedure Notes (Addendum)
Spinal  Patient location during procedure: OR Start time: 08/05/2021 9:19 AM End time: 08/05/2021 9:25 AM Reason for block: surgical anesthesia Staffing Anesthesiologist: Bethena Midget, MD Performed by: Bethena Midget, MD Authorized by: Bethena Midget, MD   Preanesthetic Checklist Completed: patient identified, IV checked, site marked, risks and benefits discussed, surgical consent, monitors and equipment checked, pre-op evaluation and timeout performed Spinal Block Patient position: sitting Prep: DuraPrep Patient monitoring: heart rate, cardiac monitor, continuous pulse ox and blood pressure Approach: midline Location: L3-4 Injection technique: single-shot Needle Needle type: Sprotte  Needle gauge: 24 G Needle length: 9 cm Assessment Sensory level: T4 Events: CSF return

## 2021-08-05 NOTE — Lactation Note (Signed)
This note was copied from a baby's chart. Lactation Consultation Note  Patient Name: Shannon Hogan Date: 08/05/2021 Reason for consult: Initial assessment;Term;Breastfeeding assistance;Other (Comment) Age:23 hours/ experienced BF of 2 months.  As LC entered the baby in the crib rooting. LC offered to assist to latch , and mom receptive for assist. Baby latched after several attempts with swallows and still feeding at 17 mins . Latch score 7, per mom comfortable.  LC reviewed breast feeding basics.  Mom aware she call call for assistance.    Maternal Data Has patient been taught Hand Expression?: Yes Does the patient have breastfeeding experience prior to this delivery?: Yes How long did the patient breastfeed?: per mom 2 months  Feeding Mother's Current Feeding Choice: Breast Milk and Formula  LATCH Score Latch: Repeated attempts needed to sustain latch, nipple held in mouth throughout feeding, stimulation needed to elicit sucking reflex.  Audible Swallowing: A few with stimulation  Type of Nipple: Everted at rest and after stimulation  Comfort (Breast/Nipple): Soft / non-tender  Hold (Positioning): Assistance needed to correctly position infant at breast and maintain latch.  LATCH Score: 7   Lactation Tools Discussed/Used    Interventions Interventions: Breast feeding basics reviewed;Assisted with latch;Skin to skin;Breast massage;Breast compression;Adjust position;Support pillows;Position options;Education;LC Services brochure  Discharge Pump: DEBP;Hands Free;Personal  Consult Status Consult Status: Follow-up Date: 08/05/21 Follow-up type: In-patient    Shannon Hogan 08/05/2021, 3:05 PM

## 2021-08-05 NOTE — Transfer of Care (Signed)
Immediate Anesthesia Transfer of Care Note  Patient: Shannon Hogan  Procedure(s) Performed: CESAREAN SECTION AND IUD INSERT  Patient Location: PACU  Anesthesia Type:Spinal  Level of Consciousness: awake, alert  and oriented  Airway & Oxygen Therapy: Patient Spontanous Breathing  Post-op Assessment: Report given to RN and Post -op Vital signs reviewed and stable  Post vital signs: Reviewed and stable  Last Vitals:  Vitals Value Taken Time  BP 117/80 08/05/21 1045  Temp 36.6 C 08/05/21 1045  Pulse 88 08/05/21 1055  Resp 17 08/05/21 1055  SpO2 95 % 08/05/21 1055  Vitals shown include unvalidated device data.  Last Pain:  Vitals:   08/05/21 0801  TempSrc:   PainSc: 0-No pain         Complications: No notable events documented.

## 2021-08-05 NOTE — Discharge Summary (Signed)
Postpartum Discharge Summary  Date of Service updated-yes     Patient Name: Shannon Hogan DOB: 2000/05/23 MRN: [id number]  Date of admission: 08/05/2021 Delivery date:08/05/2021  Delivering provider: Janyth Pupa  Date of discharge: 08/07/2021  Admitting diagnosis: Intrauterine pregnancy [Z34.90] Intrauterine pregnancy: [redacted]w[redacted]d    Secondary diagnosis:  Principal Problem:   Status post repeat low transverse cesarean section Active Problems:   Anemia in pregnancy, third trimester   History of cesarean section   History of gestational hypertension   Supervision of other normal pregnancy, antepartum   History of herpes genitalis   GBS (group B Streptococcus carrier), +RV culture, currently pregnant   Intrauterine pregnancy  Additional problems: None    Discharge diagnosis: Term Pregnancy Delivered                                              Post partum procedures: None Augmentation: N/A Complications: None  Hospital course: Sceduled C/S   21y.o. yo G2P2002 at 23w0das admitted to the hospital 08/05/2021 for scheduled cesarean section with the following indication:Elective Repeat.Delivery details are as follows:  Membrane Rupture Time/Date: 9:48 AM ,08/05/2021   Delivery Method:C-Section, Vacuum Assisted  Details of operation can be found in separate operative note.  Patient had an uncomplicated postpartum course.  She is ambulating, tolerating a regular diet, passing flatus, and urinating well. Patient is discharged home in stable condition on  08/07/21        Newborn Data: Birth date:08/05/2021  Birth time:9:49 AM  Gender:Female  Living status:Living  Apgars:8 ,9  Weight:3330 g     Magnesium Sulfate received: No BMZ received: No Rhophylac:N/A MMR:N/A T-DaP:Given prenatally Flu: Yes Transfusion:No  Physical exam  Vitals:   08/06/21 0500 08/06/21 1615 08/06/21 2154 08/07/21 0527  BP: 110/68 112/67 115/68 111/77  Pulse:  84 82 72  Resp:  '16 16 16  ' Temp:   97.9 F (36.6 C) 98.8 F (37.1 C) 98.4 F (36.9 C)  TempSrc:  Oral Oral Oral  SpO2:   100% 100%  Weight:      Height:       General: alert, cooperative, and no distress Lochia: appropriate Uterine Fundus: firm Incision: Healing well with no significant drainage, No significant erythema, Dressing is clean, dry, and intact DVT Evaluation: No evidence of DVT seen on physical exam. Negative Homan's sign. No cords or calf tenderness. No significant calf/ankle edema. Labs: Lab Results  Component Value Date   WBC 11.1 (H) 08/07/2021   HGB 9.5 (L) 08/07/2021   HCT 29.3 (L) 08/07/2021   MCV 83.2 08/07/2021   PLT 238 08/07/2021      Latest Ref Rng & Units 02/12/2021   11:13 AM  CMP  Glucose 70 - 99 mg/dL 81   BUN 6 - 20 mg/dL 8   Creatinine 0.57 - 1.00 mg/dL 0.53   Sodium 134 - 144 mmol/L 136   Potassium 3.5 - 5.2 mmol/L 4.0   Chloride 96 - 106 mmol/L 100   CO2 20 - 29 mmol/L 24   Calcium 8.7 - 10.2 mg/dL 9.7   Total Protein 6.0 - 8.5 g/dL 7.3   Total Bilirubin 0.0 - 1.2 mg/dL 0.5   Alkaline Phos 44 - 121 IU/L 64   AST 0 - 40 IU/L 16   ALT 0 - 32 IU/L 13    Edinburgh Score:  05/10/2019    8:34 AM  Edinburgh Postnatal Depression Scale Screening Tool  I have been able to laugh and see the funny side of things. 1  I have looked forward with enjoyment to things. 2  I have blamed myself unnecessarily when things went wrong. 2  I have been anxious or worried for no good reason. 0  I have felt scared or panicky for no good reason. 0  Things have been getting on top of me. 2  I have been so unhappy that I have had difficulty sleeping. 3  I have felt sad or miserable. 2  I have been so unhappy that I have been crying. 2  The thought of harming myself has occurred to me. 1  Edinburgh Postnatal Depression Scale Total 15     After visit meds:  Allergies as of 08/07/2021   No Known Allergies      Medication List     STOP taking these medications    aspirin EC 81 MG  tablet       TAKE these medications    ibuprofen 600 MG tablet Commonly known as: ADVIL Take 1 tablet (600 mg total) by mouth every 6 (six) hours.   oxyCODONE 5 MG immediate release tablet Commonly known as: Oxy IR/ROXICODONE Take 1 tablet (5 mg total) by mouth every 4 (four) hours as needed for moderate pain.   Prenatal Plus Vitamin/Mineral 27-1 MG Tabs Take 1 tablet by mouth daily at 6 (six) AM.         Discharge home in stable condition Infant Feeding: Breast Infant Disposition:home with mother Discharge instruction: per After Visit Summary and Postpartum booklet. Activity: Advance as tolerated. Pelvic rest for 6 weeks.  Diet: routine diet Future Appointments: Future Appointments  Date Time Provider [place]  08/12/2021 10:50 AM Sloan Leiter, MD [facility]  09/16/2021 10:30 AM Radene Gunning, MD [facility]   Follow up Visit: Message sent to [facility] by Dr. Cy Blamer on 08/05/2021  Please schedule this patient for a In person postpartum visit in 6 weeks with the following provider: Any provider. Additional Postpartum F/U:Incision check 1 week and repeat PAP Low risk pregnancy complicated by:  None Delivery mode:  C-Section, Vacuum Assisted  Anticipated Birth Control:  PP Mirena IUD placed   08/07/2021 Julianne Handler, CNM

## 2021-08-05 NOTE — Anesthesia Preprocedure Evaluation (Addendum)
Anesthesia Evaluation  Patient identified by MRN, date of birth, ID band  Reviewed: Allergy & Precautions, H&P , NPO status , Patient's Chart, lab work & pertinent test results, reviewed documented beta blocker date and time   Airway Mallampati: II  TM Distance: >3 FB Neck ROM: Full    Dental no notable dental hx. (+) Teeth Intact, Dental Advisory Given   Pulmonary  intubated   Pulmonary exam normal breath sounds clear to auscultation       Cardiovascular Exercise Tolerance: Good hypertension, Pt. on medications negative cardio ROS Normal cardiovascular exam Rhythm:regular Rate:Tachycardia     Neuro/Psych negative neurological ROS  negative psych ROS   GI/Hepatic negative GI ROS, Neg liver ROS,   Endo/Other  negative endocrine ROS  Renal/GU negative Renal ROS  negative genitourinary   Musculoskeletal   Abdominal   Peds  Hematology  (+) Blood dyscrasia, anemia ,   Anesthesia Other Findings   Reproductive/Obstetrics negative OB ROS                           Anesthesia Physical  Anesthesia Plan  ASA: 3  Anesthesia Plan: Spinal   Post-op Pain Management: Minimal or no pain anticipated   Induction: Intravenous  PONV Risk Score and Plan: Treatment may vary due to age or medical condition  Airway Management Planned: Natural Airway  Additional Equipment: None  Intra-op Plan:   Post-operative Plan:   Informed Consent:     Dental advisory given  Plan Discussed with: CRNA and Anesthesiologist  Anesthesia Plan Comments:        Anesthesia Quick Evaluation

## 2021-08-06 ENCOUNTER — Encounter (HOSPITAL_COMMUNITY): Payer: Self-pay | Admitting: Obstetrics & Gynecology

## 2021-08-06 ENCOUNTER — Encounter: Payer: Medicaid Other | Admitting: Certified Nurse Midwife

## 2021-08-06 LAB — CBC
HCT: 27.1 % — ABNORMAL LOW (ref 36.0–46.0)
Hemoglobin: 9.3 g/dL — ABNORMAL LOW (ref 12.0–15.0)
MCH: 28.2 pg (ref 26.0–34.0)
MCHC: 34.3 g/dL (ref 30.0–36.0)
MCV: 82.1 fL (ref 80.0–100.0)
Platelets: 205 10*3/uL (ref 150–400)
RBC: 3.3 MIL/uL — ABNORMAL LOW (ref 3.87–5.11)
RDW: 12.6 % (ref 11.5–15.5)
WBC: 16.7 10*3/uL — ABNORMAL HIGH (ref 4.0–10.5)
nRBC: 0 % (ref 0.0–0.2)

## 2021-08-06 MED ORDER — FERROUS SULFATE 325 (65 FE) MG PO TABS
325.0000 mg | ORAL_TABLET | ORAL | Status: DC
  Administered 2021-08-07: 325 mg via ORAL
  Filled 2021-08-06: qty 1

## 2021-08-06 NOTE — Progress Notes (Signed)
POSTPARTUM PROGRESS NOTE  Subjective: Shannon Hogan is a 21 y.o. V6P7948 PPD#1 s/p pLTCS at [redacted]w[redacted]d.  She reports she doing well. No acute events overnight. She denies any problems with ambulating, voiding or po intake. Denies nausea or vomiting. She has  passed flatus. Pain is well controlled.  Lochia is scant.  Objective: Blood pressure 110/68, pulse 81, temperature 98.1 F (36.7 C), resp. rate 18, height 5\' 2"  (1.575 m), weight 74.4 kg, last menstrual period 11/05/2020, SpO2 99 %, unknown if currently breastfeeding.  Physical Exam:  General: alert, cooperative and no distress Chest: no respiratory distress Abdomen: soft, non-tender  incision clean/dry/intact  Uterine Fundus: firm, appropriately tender Extremities: No calf swelling or tenderness  no edema  Recent Labs    08/03/21 1026 08/06/21 0436  HGB 11.0* 9.3*  HCT 33.3* 27.1*    Assessment/Plan: Shannon Hogan is a 21 y.o. 21 s/p PPD#1 s/p pLTCS at [redacted]w[redacted]d  POD#1: Doing well, pain well-controlled. H/H appropriate. Start PO iron -- Routine postpartum care -- Encouraged up OOB -- Lovenox for VTE prophylaxis  Routine Postpartum Care -- Contraception: PP IUD placed -- Feeding: breast at bottle  Dispo: Plan for discharge POD#2 or 3 per clinical status.  [redacted]w[redacted]d, MD, MPH OB Fellow, [facility] for [facility]

## 2021-08-06 NOTE — Lactation Note (Signed)
This note was copied from a baby's chart. Lactation Consultation Note  Patient Name: Girl Warren Kugelman NOMVE'H Date: 08/06/2021 Reason for consult: Follow-up assessment;Term;Infant weight loss;Breastfeeding assistance;Other (Comment) (LC noted baby being non - nutritive . LC had mom release the latch . LC assisted to relatched the baby with better depth) Age:23 hours LC showed mom how deep onto the areola the baby's mouth should be for a a proper latch. Latch score 8 and per mom comfortable.  Baby fed 15 mins with increased swallows. Nipple well rounded when baby released.  Per mom sore on her right nipple . LC offered to assess and noted a positional strip and areola edema. LC reviewed steps for latching, and reverse pressure prior to latching, importance of firm support.  LC provided shells , hand pump for pre pumping tonight , due to moms milk coming in.  MBURN Nila Nephew aware moms milk is coming in.  [facility] updated the doc flow sheets her mom.   Maternal Data Has patient been taught Hand Expression?: Yes  Feeding Mother's Current Feeding Choice: Breast Milk and Formula  LATCH Score Latch: Grasps breast easily, tongue down, lips flanged, rhythmical sucking.  Audible Swallowing: A few with stimulation  Type of Nipple: Everted at rest and after stimulation  Comfort (Breast/Nipple): Soft / non-tender  Hold (Positioning): Assistance needed to correctly position infant at breast and maintain latch.  LATCH Score: 8   Lactation Tools Discussed/Used    Interventions Interventions: Breast feeding basics reviewed;Assisted with latch;Skin to skin;Breast massage;Hand express;Pre-pump if needed;Reverse pressure;Breast compression;Adjust position;Support pillows;Position options;Shells;Hand pump;Education;LC Services brochure  Discharge Pump: DEBP;Hands Free;Personal WIC Program: Yes  Consult Status Consult Status: Follow-up Date: 08/07/21 Follow-up type:  In-patient    Matilde Sprang Hilda Rynders 08/06/2021, 1:35 PM

## 2021-08-06 NOTE — Social Work (Signed)
CSW received consult for hx of PPD.  CSW met with MOB to offer support and complete assessment.    CSW met with MOB at bedside and introduced CSW. CSW observed MOB in bed holding the infant. MOB presented calm and was receptive to the visit. CSW inquired how MOB has felt since giving birth. MOB expressed feeling tired and shared the L&D was a good experience in comparison to her first birth. CSW inquired how MOB felt during the pregnancy. MOB reported that she was hormonal and felt "up and down." MOB reported that she had PPD following the birth of her son. MOB reported, "I knew it was postpartum when his cry was like nails on a chalk board." MOB reported that her provider referred her to a social worker which MOB did not find helpful since they related her symptoms to "not having a job." MOB reported the PPD lasted for about a year before she started feeling better. MOB reported she has been feeling emotionally well since then with no concerns. MOB reported if concerns arise, she will reach out to her provider again. MOB was receptive to the PPD follow up information provided. CSW assessed MOB for safety MOB denied thoughts of harm to self and others.   CSW provided education regarding the baby blues period vs. perinatal mood disorders, discussed treatment and gave resources for mental health follow up if concerns arise.  CSW recommended MOB complete a self-evaluation during the postpartum time period using the New Mom Checklist from Postpartum Progress and encouraged MOB to contact a medical professional if symptoms are noted at any time.    MOB reported she has items for the infant including a bassinet where the infant will sleep. CSW provided review of Sudden Infant Death Syndrome (SIDS) precautions. MOB has chosen [facility] for the infant's follow up care. CSW assessed MOB for additional needs. MOB reported no further need.   CSW identifies no further need for intervention and no barriers to  discharge at this time.   Shannon Hogan, MSW, LCSW [facility]  Clinical Social Worker  [phone] 08/06/2021  1:10 PM  

## 2021-08-06 NOTE — Progress Notes (Signed)
POD#1 s/p RCS with IUD insertion  Subjective:  Shannon Hogan is a 21 y.o. P9Y9244 [redacted]w[redacted]d s/p LTCS with IUD placement.  No acute events overnight.  Pt {Actions; denies-reports:120008::"denies"} problems with ambulating, voiding or po intake.  She {Actions; denies-reports:120008::"denies"} nausea or vomiting.  Pain is {DESC; WELL/MODERATELY/POORLY:30679} controlled.  She {has/has not had:19237::"has had"} flatus.  Lochia {Desc; minimal/small/moderate/large/very large:110034::"Minimal"}.  Plan for birth control is {PLAN CONTRACEPTION:313102}.  Method of Feeding: {Breast Bottle:20261}  Objective: Blood pressure 110/68, pulse 81, temperature 98.1 F (36.7 C), resp. rate 18, height 5\' 2"  (1.575 m), weight 74.4 kg, last menstrual period 11/05/2020, SpO2 99 %, unknown if currently breastfeeding.  Physical Exam:  General: alert, cooperative and no distress Lochia:normal flow Chest: normal WOB Heart: Regular rate Abdomen: +BS, soft, mild TTP (appropriate) Uterine Fundus: firm, *** DVT Evaluation: No evidence of DVT seen on physical exam. Extremities: *** edema  Recent Labs    08/06/21 0436  HGB 9.3*  HCT 27.1*    Assessment/Plan:  ASSESSMENT: Shannon Hogan is a 21 y.o. 21 [redacted]w[redacted]d s/p ***  {assessment/plan:3041427} Continue routine PP care Breastfeeding support PRN  Acute blood loss anemia (11.0--> 9.3) with documented EBL 538. Offered IV venofer. Patient ***    LOS: 1 day   [redacted]w[redacted]d 08/06/2021, 12:07 PM

## 2021-08-07 LAB — URINALYSIS, ROUTINE W REFLEX MICROSCOPIC
Bilirubin Urine: NEGATIVE
Glucose, UA: NEGATIVE mg/dL
Ketones, ur: NEGATIVE mg/dL
Leukocytes,Ua: NEGATIVE
Nitrite: NEGATIVE
Protein, ur: NEGATIVE mg/dL
RBC / HPF: >50 RBC/hpf — ABNORMAL HIGH (ref 0–5)
Specific Gravity, Urine: 1.011 (ref 1.005–1.030)
pH: 6 (ref 5.0–8.0)

## 2021-08-07 LAB — CBC WITH DIFFERENTIAL/PLATELET
Abs Immature Granulocytes: 0.04 10*3/uL (ref 0.00–0.07)
Basophils Absolute: 0 10*3/uL (ref 0.0–0.1)
Basophils Relative: 0 %
Eosinophils Absolute: 0.1 10*3/uL (ref 0.0–0.5)
Eosinophils Relative: 1 %
HCT: 29.3 % — ABNORMAL LOW (ref 36.0–46.0)
Hemoglobin: 9.5 g/dL — ABNORMAL LOW (ref 12.0–15.0)
Immature Granulocytes: 0 %
Lymphocytes Relative: 30 %
Lymphs Abs: 3.3 10*3/uL (ref 0.7–4.0)
MCH: 27 pg (ref 26.0–34.0)
MCHC: 32.4 g/dL (ref 30.0–36.0)
MCV: 83.2 fL (ref 80.0–100.0)
Monocytes Absolute: 1.1 10*3/uL — ABNORMAL HIGH (ref 0.1–1.0)
Monocytes Relative: 10 %
Neutro Abs: 6.5 10*3/uL (ref 1.7–7.7)
Neutrophils Relative %: 59 %
Platelets: 238 10*3/uL (ref 150–400)
RBC: 3.52 MIL/uL — ABNORMAL LOW (ref 3.87–5.11)
RDW: 12.9 % (ref 11.5–15.5)
WBC: 11.1 10*3/uL — ABNORMAL HIGH (ref 4.0–10.5)
nRBC: 0 % (ref 0.0–0.2)

## 2021-08-07 LAB — GLUCOSE, CAPILLARY: Glucose-Capillary: 73 mg/dL (ref 70–99)

## 2021-08-07 MED ORDER — IBUPROFEN 600 MG PO TABS: 600.0000 mg | ORAL_TABLET | Freq: Four times a day (QID) | ORAL | 0 refills | Status: DC

## 2021-08-07 MED ORDER — OXYCODONE HCL 5 MG PO TABS
5.0000 mg | ORAL_TABLET | ORAL | 0 refills | Status: DC | PRN
Start: 2021-08-07 — End: 2021-09-01

## 2021-08-07 NOTE — Progress Notes (Signed)
POSTPARTUM PROGRESS NOTE  Post Partum Day 2  Subjective:  Shannon Hogan is a 21 y.o. B5M0802 s/p rC/S at [redacted]w[redacted]d.  No acute events overnight.  Pt reports problems with ambulating, voiding or po intake, specifically prior to provider entering the room she stated that she got up to take a shower and felt dizzy, so she laid back down in the bed. Patient had a scopolamine patch on that she said she was given prior to her surgery. This medication was discontinued per Epic, but patient was still wearing patch and was advised to removed. Patient removed while provider was in the room. She also reports occasional leg cramps. She denies nausea or vomiting.  Pain is well controlled.  Lochia Minimal, pt not wearing a pad at the time of provider evaluation.   Objective: Blood pressure 111/77, pulse 72, temperature 98.4 F (36.9 C), temperature source Oral, resp. rate 16, height 5\' 2"  (1.575 m), weight 74.4 kg, last menstrual period 11/05/2020, SpO2 100 %, unknown if currently breastfeeding.  Patient Vitals for the past 24 hrs:  BP Temp Temp src Pulse Resp SpO2  08/07/21 0527 111/77 98.4 F (36.9 C) Oral 72 16 100 %  08/06/21 2154 115/68 98.8 F (37.1 C) Oral 82 16 100 %  08/06/21 1615 112/67 97.9 F (36.6 C) Oral 84 16 --   Physical Exam:  General: alert, cooperative and no distress Chest: no respiratory distress Heart:regular rate, distal pulses intact Abdomen: soft, nontender,  Uterine Fundus: firm, appropriately tender DVT Evaluation: No calf swelling or tenderness Extremities: no edema Skin: warm, dry; incision clean/dry/intact overall, but small area of bleeding noted that appears to be new on lower left corner of dressing, will monitor.  Recent Labs    08/06/21 0436  HGB 9.3*  HCT 27.1*    Assessment/Plan: Shannon Hogan is a 21 y.o. 21 s/p rC/S at [redacted]w[redacted]d   PPD#2 - Doing well, but need to observe today for dizziness, leg cramps, small area of bright red blood on wound  dressing. Orthostatics, UA, CBC, wound check ordered. Can consider discharge later tonight but preferably tomorrow AM. Contraception: IP IUD Feeding: breast Dispo: Plan for discharge later tonight or tomorrow.   LOS: 2 days   Thera Basden, [redacted]w[redacted]d, NP 08/07/2021, 10:32 AM

## 2021-08-07 NOTE — Progress Notes (Signed)
Reassessment for possible discharge:  S: Feeling much better.  O:  VSS  A/P:  POD #2 Labs reviewed and stable. Not orthostatic, feels well. Stable for discharge home

## 2021-08-07 NOTE — Lactation Note (Signed)
This note was copied from a baby's chart. Lactation Consultation Note  Patient Name: Shannon Hogan [id number] Date: 08/07/2021 Reason for consult: Follow-up assessment;Infant weight loss;Other (Comment);Nipple pain/trauma;Mother's request;Term (7 % weight loss,) Age:23 hours  Per mom nipples are sore.  LC offered to assess / mom receptive.  Right nipple - positional strip the same has yesterday. The Left - positional strip covering  the entire front of the nipple. No breakdown.  LC reviewed the tx for sore nipples , steps for latching and to give the sorest nipple a break and just pump. See below.  Mom has the [facility] resource for after D/C.   Maternal Data Has patient been taught Hand Expression?: Yes  Feeding Mother's Current Feeding Choice: Breast Milk and Formula Nipple Type: Dr. Irving Burton level 1 (mother's personal bottle)  LATCH Score                    Lactation Tools Discussed/Used Tools: Shells;Pump;Flanges Flange Size: 24;27 Breast pump type: Manual Pump Education: Milk Storage Reason for Pumping: LC recommended due to sore nipples - 1st breast breast massage, hand express, prepump and reverse pressure. Feed on the least sore nipple then supplement / post pump ( give the sorest side a break )  Interventions    Discharge Discharge Education: Engorgement and breast care;Warning signs for feeding baby;Outpatient recommendation;Other (comment) (if needed .) Pump: DEBP;Hands Free [facility] Program: Yes  Consult Status Consult Status: Complete Date: 08/07/21    Kathrin Greathouse 08/07/2021, 9:13 AM

## 2021-08-12 ENCOUNTER — Ambulatory Visit: Payer: Medicaid Other | Admitting: Obstetrics and Gynecology

## 2021-08-13 ENCOUNTER — Encounter: Payer: Medicaid Other | Admitting: Obstetrics and Gynecology

## 2021-08-13 ENCOUNTER — Telehealth (HOSPITAL_COMMUNITY): Payer: Self-pay | Admitting: *Deleted

## 2021-08-17 ENCOUNTER — Encounter: Payer: Self-pay | Admitting: *Deleted

## 2021-08-17 ENCOUNTER — Ambulatory Visit (INDEPENDENT_AMBULATORY_CARE_PROVIDER_SITE_OTHER): Payer: Medicaid Other | Admitting: *Deleted

## 2021-08-17 VITALS — BP 121/79 | HR 89 | Ht 62.0 in | Wt 148.0 lb

## 2021-08-17 DIAGNOSIS — R300 Dysuria: Secondary | ICD-10-CM

## 2021-08-17 LAB — POCT URINALYSIS DIPSTICK
Bilirubin, UA: NEGATIVE
Glucose, UA: NEGATIVE
Nitrite, UA: NEGATIVE
Protein, UA: POSITIVE — AB
Spec Grav, UA: 1.025 (ref 1.010–1.025)
Urobilinogen, UA: NEGATIVE E.U./dL — AB
pH, UA: 6 (ref 5.0–8.0)

## 2021-08-17 NOTE — Progress Notes (Cosign Needed)
Pt here for 1 week incision check.  Honeycomb dressing was removed by patient but steri strips are intact.  The incision is dry and clean.  Pt does c/o dysuria.  Urine POCT was abnormal but no nitrites.  Urine culture sent.

## 2021-08-19 LAB — URINE CULTURE
MICRO NUMBER:: 13582709
SPECIMEN QUALITY:: ADEQUATE

## 2021-08-20 NOTE — Progress Notes (Signed)
Chart reviewed for nurse visit. Agree with plan of care.   Duane Lope, NP 08/20/2021 11:49 AM

## 2021-08-21 DIAGNOSIS — Z419 Encounter for procedure for purposes other than remedying health state, unspecified: Secondary | ICD-10-CM | POA: Diagnosis not present

## 2021-09-01 ENCOUNTER — Emergency Department (HOSPITAL_BASED_OUTPATIENT_CLINIC_OR_DEPARTMENT_OTHER)
Admission: EM | Admit: 2021-09-01 | Discharge: 2021-09-02 | Disposition: A | Discharge status: Home / Self Care | Payer: Medicaid Other | Attending: Emergency Medicine | Admitting: Emergency Medicine

## 2021-09-01 ENCOUNTER — Encounter (HOSPITAL_BASED_OUTPATIENT_CLINIC_OR_DEPARTMENT_OTHER): Payer: Self-pay | Admitting: Emergency Medicine

## 2021-09-01 DIAGNOSIS — L304 Erythema intertrigo: Secondary | ICD-10-CM | POA: Insufficient documentation

## 2021-09-01 DIAGNOSIS — B372 Candidiasis of skin and nail: Secondary | ICD-10-CM | POA: Diagnosis not present

## 2021-09-01 DIAGNOSIS — Z4800 Encounter for change or removal of nonsurgical wound dressing: Secondary | ICD-10-CM | POA: Diagnosis present

## 2021-09-01 LAB — CBC WITH DIFFERENTIAL/PLATELET
Abs Immature Granulocytes: 0.02 10*3/uL (ref 0.00–0.07)
Basophils Absolute: 0 10*3/uL (ref 0.0–0.1)
Basophils Relative: 0 %
Eosinophils Absolute: 0.5 10*3/uL (ref 0.0–0.5)
Eosinophils Relative: 5 %
HCT: 38 % (ref 36.0–46.0)
Hemoglobin: 12.6 g/dL (ref 12.0–15.0)
Immature Granulocytes: 0 %
Lymphocytes Relative: 39 %
Lymphs Abs: 3.3 10*3/uL (ref 0.7–4.0)
MCH: 27.5 pg (ref 26.0–34.0)
MCHC: 33.2 g/dL (ref 30.0–36.0)
MCV: 83 fL (ref 80.0–100.0)
Monocytes Absolute: 0.7 10*3/uL (ref 0.1–1.0)
Monocytes Relative: 8 %
Neutro Abs: 4.1 10*3/uL (ref 1.7–7.7)
Neutrophils Relative %: 48 %
Platelets: 311 10*3/uL (ref 150–400)
RBC: 4.58 MIL/uL (ref 3.87–5.11)
RDW: 13.2 % (ref 11.5–15.5)
WBC: 8.6 10*3/uL (ref 4.0–10.5)
nRBC: 0 % (ref 0.0–0.2)

## 2021-09-01 LAB — URINALYSIS, ROUTINE W REFLEX MICROSCOPIC
Bilirubin Urine: NEGATIVE
Glucose, UA: NEGATIVE mg/dL
Ketones, ur: NEGATIVE mg/dL
Nitrite: NEGATIVE
Protein, ur: NEGATIVE mg/dL
Specific Gravity, Urine: 1.025 (ref 1.005–1.030)
pH: 5.5 (ref 5.0–8.0)

## 2021-09-01 LAB — BASIC METABOLIC PANEL
Anion gap: 9 (ref 5–15)
BUN: 17 mg/dL (ref 6–20)
CO2: 21 mmol/L — ABNORMAL LOW (ref 22–32)
Calcium: 8.9 mg/dL (ref 8.9–10.3)
Chloride: 109 mmol/L (ref 98–111)
Creatinine, Ser: 0.69 mg/dL (ref 0.44–1.00)
GFR, Estimated: >60 mL/min (ref >60)
Glucose, Bld: 89 mg/dL (ref 70–99)
Potassium: 3.7 mmol/L (ref 3.5–5.1)
Sodium: 139 mmol/L (ref 135–145)

## 2021-09-01 LAB — URINALYSIS, MICROSCOPIC (REFLEX)

## 2021-09-01 MED ORDER — CLOTRIMAZOLE POWD: 0 refills | Status: DC

## 2021-09-01 NOTE — ED Triage Notes (Signed)
Pt reports having a c-section on 6/15. Requesting wound check due to drainage and odor that started 1.5 wks ago. Denies fevers, n/v.

## 2021-09-02 NOTE — ED Notes (Signed)
Rx x 1 given  Written and verbal inst to pt  Verbalized an understanding  To home with significant other

## 2021-09-02 NOTE — ED Provider Notes (Signed)
[facility] EMERGENCY DEPARTMENT Provider Note   CSN: [id number] Arrival date & time: 09/01/21  2218     History  Chief Complaint  Patient presents with   Wound Check    Shannon Hogan is a 21 y.o. female.  The history is provided by the patient.  Wound Check This is a new problem. The current episode started more than 2 days ago. The problem occurs daily. The problem has been gradually worsening.  Patient reports she had an uneventful C-section delivery around June 15 She reports over the past week and a half she has had foul-smelling drainage from the C-section scar No fevers or vomiting.  No significant pain.     Home Medications Prior to Admission medications   Medication Sig Start Date End Date Taking? Authorizing Provider  Clotrimazole POWD Apply to affected area BID for one week 09/01/21  Yes Zadie Rhine, MD  levonorgestrel (MIRENA) 20 MCG/DAY IUD 1 each by Intrauterine route once.    [provider]  Prenatal Vit-Fe Fumarate-FA (PRENATAL PLUS VITAMIN/MINERAL) 27-1 MG TABS Take 1 tablet by mouth daily at 6 (six) AM. 01/04/21   [name], CNM      Allergies    Patient has no known allergies.    Review of Systems   Review of Systems  Constitutional:  Negative for fever.    Physical Exam Updated Vital Signs BP 117/70 (BP Location: Left Arm)   Pulse 66   Temp 98.8 F (37.1 C) (Oral)   Resp 16   Ht 1.575 m (5\' 2" )   Wt 64.9 kg   SpO2 99%   BMI 26.16 kg/m  Physical Exam CONSTITUTIONAL: Well developed/well nourished HEAD: Normocephalic/atraumatic EYES: EOMI ABDOMEN: soft, nontender, see photo NEURO: Pt is awake/alert/appropriate, moves all extremitiesx4.  No facial droop.   EXTREMITIES: full ROM SKIN: warm, color normal, see photo, no crepitus, no induration, no tenderness PSYCH: no abnormalities of mood noted, alert and oriented to situation   Patient gave verbal permission to utilize photo for medical documentation  only The image was not stored on any personal device ED Results / Procedures / Treatments   Labs (all labs ordered are listed, but only abnormal results are displayed) Labs Reviewed  BASIC METABOLIC PANEL - Abnormal; Notable for the following components:      Result Value   CO2 21 (*)    All other components within normal limits  URINALYSIS, ROUTINE W REFLEX MICROSCOPIC - Abnormal; Notable for the following components:   Hgb urine dipstick MODERATE (*)    Leukocytes,Ua SMALL (*)    All other components within normal limits  URINALYSIS, MICROSCOPIC (REFLEX) - Abnormal; Notable for the following components:   Bacteria, UA RARE (*)    All other components within normal limits  CBC WITH DIFFERENTIAL/PLATELET    EKG None  Radiology No results found.  Procedures Procedures    Medications Ordered in ED Medications - No data to display  ED Course/ Medical Decision Making/ A&P                           Medical Decision Making Amount and/or Complexity of Data Reviewed Labs: ordered.  Risk Prescription drug management.   Personally reviewed lab and are overall unremarkable.  Wound overall appears to be healing well.  There is no induration or crepitus  To suggest deep space infection  Strong suspicion this is candidal intertrigo.  Steri-Strips removed.  Discussed wound care, will give topical  antifungal She can follow-up as an outpatient with OB/GYN Patient overall well-appearing and is not septic appearing.       Final Clinical Impression(s) / ED Diagnoses Final diagnoses:  Candidal intertrigo    Rx / DC Orders ED Discharge Orders          Ordered    Clotrimazole POWD        09/01/21 2348              Zadie Rhine, MD 09/02/21 0041

## 2021-09-16 ENCOUNTER — Ambulatory Visit: Payer: Medicaid Other | Admitting: Obstetrics and Gynecology

## 2021-09-17 ENCOUNTER — Telehealth: Payer: Self-pay | Admitting: *Deleted

## 2021-09-17 NOTE — Telephone Encounter (Signed)
Left patient an urgent message to call and reschedule 6 week Postpartum/pap appointment within insurance Postpartum timeframe for visit to be covered by insurance.

## 2021-09-21 ENCOUNTER — Ambulatory Visit: Payer: Medicaid Other | Admitting: Obstetrics and Gynecology

## 2021-09-21 DIAGNOSIS — Z419 Encounter for procedure for purposes other than remedying health state, unspecified: Secondary | ICD-10-CM | POA: Diagnosis not present

## 2021-10-22 DIAGNOSIS — Z419 Encounter for procedure for purposes other than remedying health state, unspecified: Secondary | ICD-10-CM | POA: Diagnosis not present

## 2021-11-21 DIAGNOSIS — Z419 Encounter for procedure for purposes other than remedying health state, unspecified: Secondary | ICD-10-CM | POA: Diagnosis not present

## 2021-12-22 DIAGNOSIS — Z419 Encounter for procedure for purposes other than remedying health state, unspecified: Secondary | ICD-10-CM | POA: Diagnosis not present

## 2022-01-21 DIAGNOSIS — Z419 Encounter for procedure for purposes other than remedying health state, unspecified: Secondary | ICD-10-CM | POA: Diagnosis not present

## 2022-02-21 DIAGNOSIS — Z419 Encounter for procedure for purposes other than remedying health state, unspecified: Secondary | ICD-10-CM | POA: Diagnosis not present

## 2022-03-24 DIAGNOSIS — Z419 Encounter for procedure for purposes other than remedying health state, unspecified: Secondary | ICD-10-CM | POA: Diagnosis not present

## 2022-04-01 IMAGING — CR DG HIP (WITH OR WITHOUT PELVIS) 2-3V*R*
3 series · 3 of 3 positions shown · non-contrast
Comparison: August 25, 2020.

CLINICAL DATA: Pelvic pain after surgical fixation of pelvic
fractures.

EXAM:
DG HIP (WITH OR WITHOUT PELVIS) 2-3V RIGHT

[t pelvis a.p.]
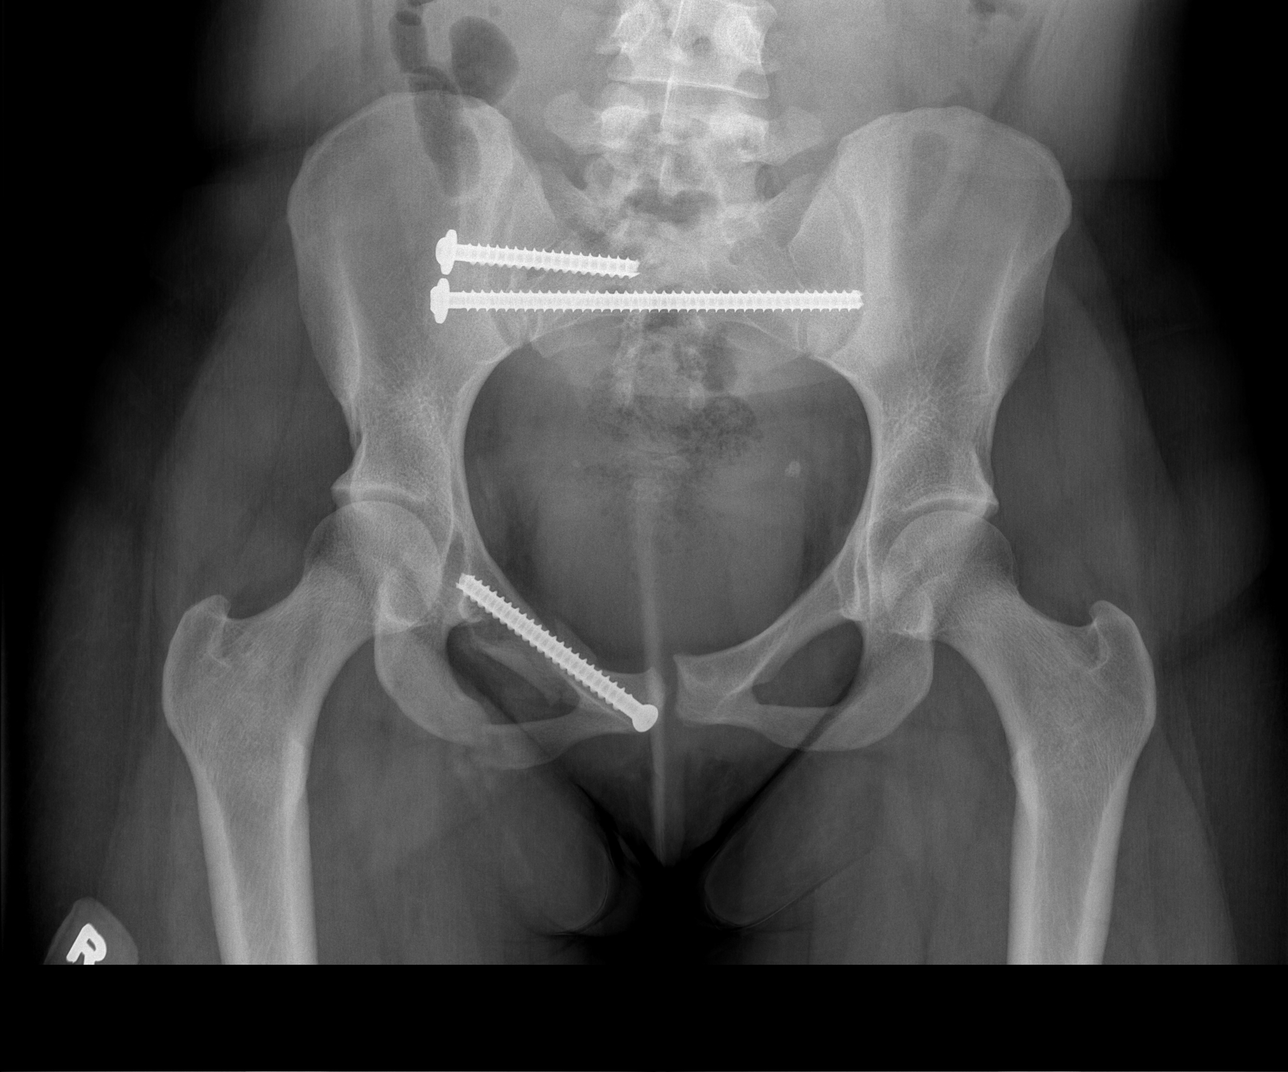

[t hip ap right]
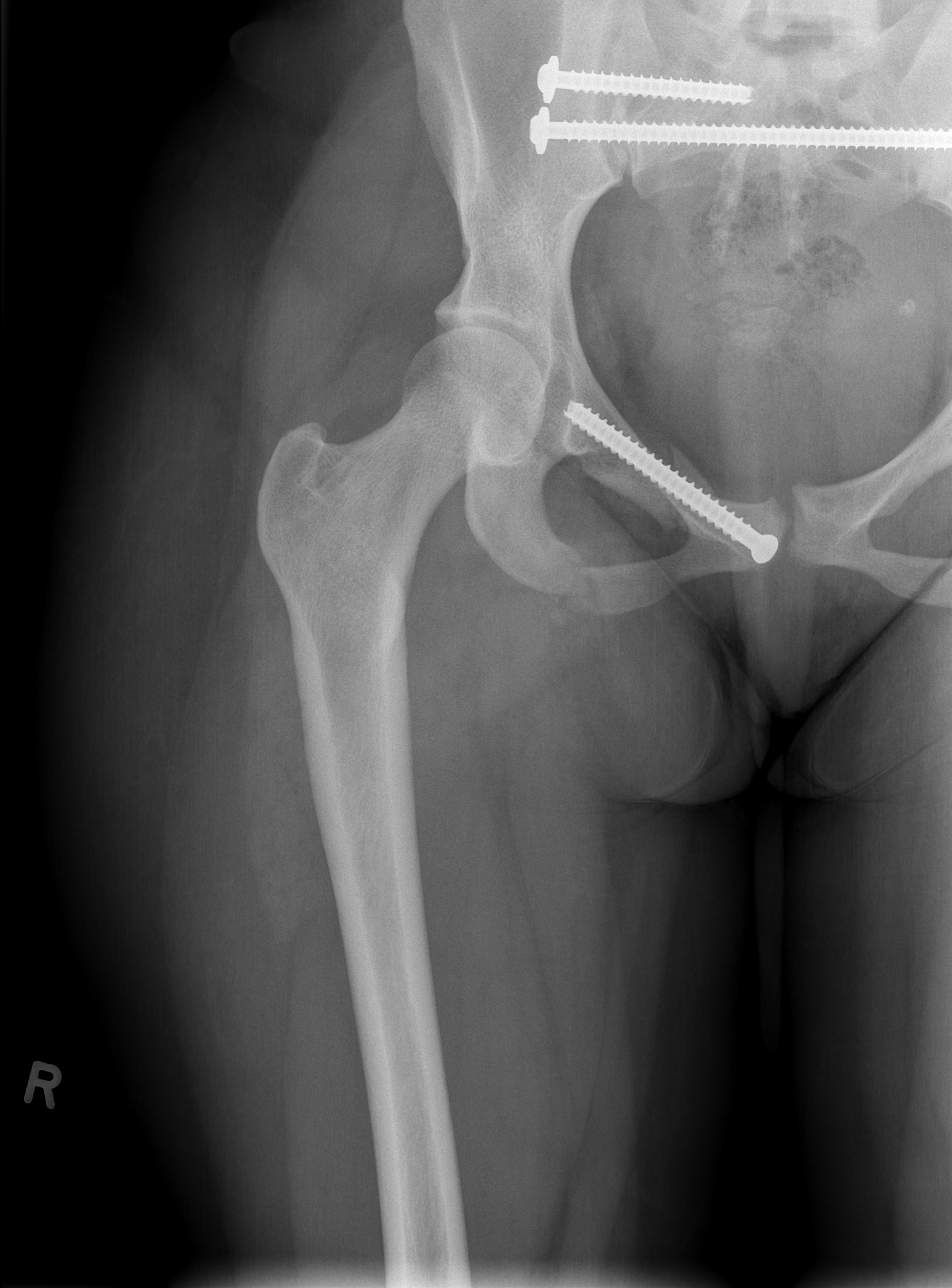

[t hip frog leg right]
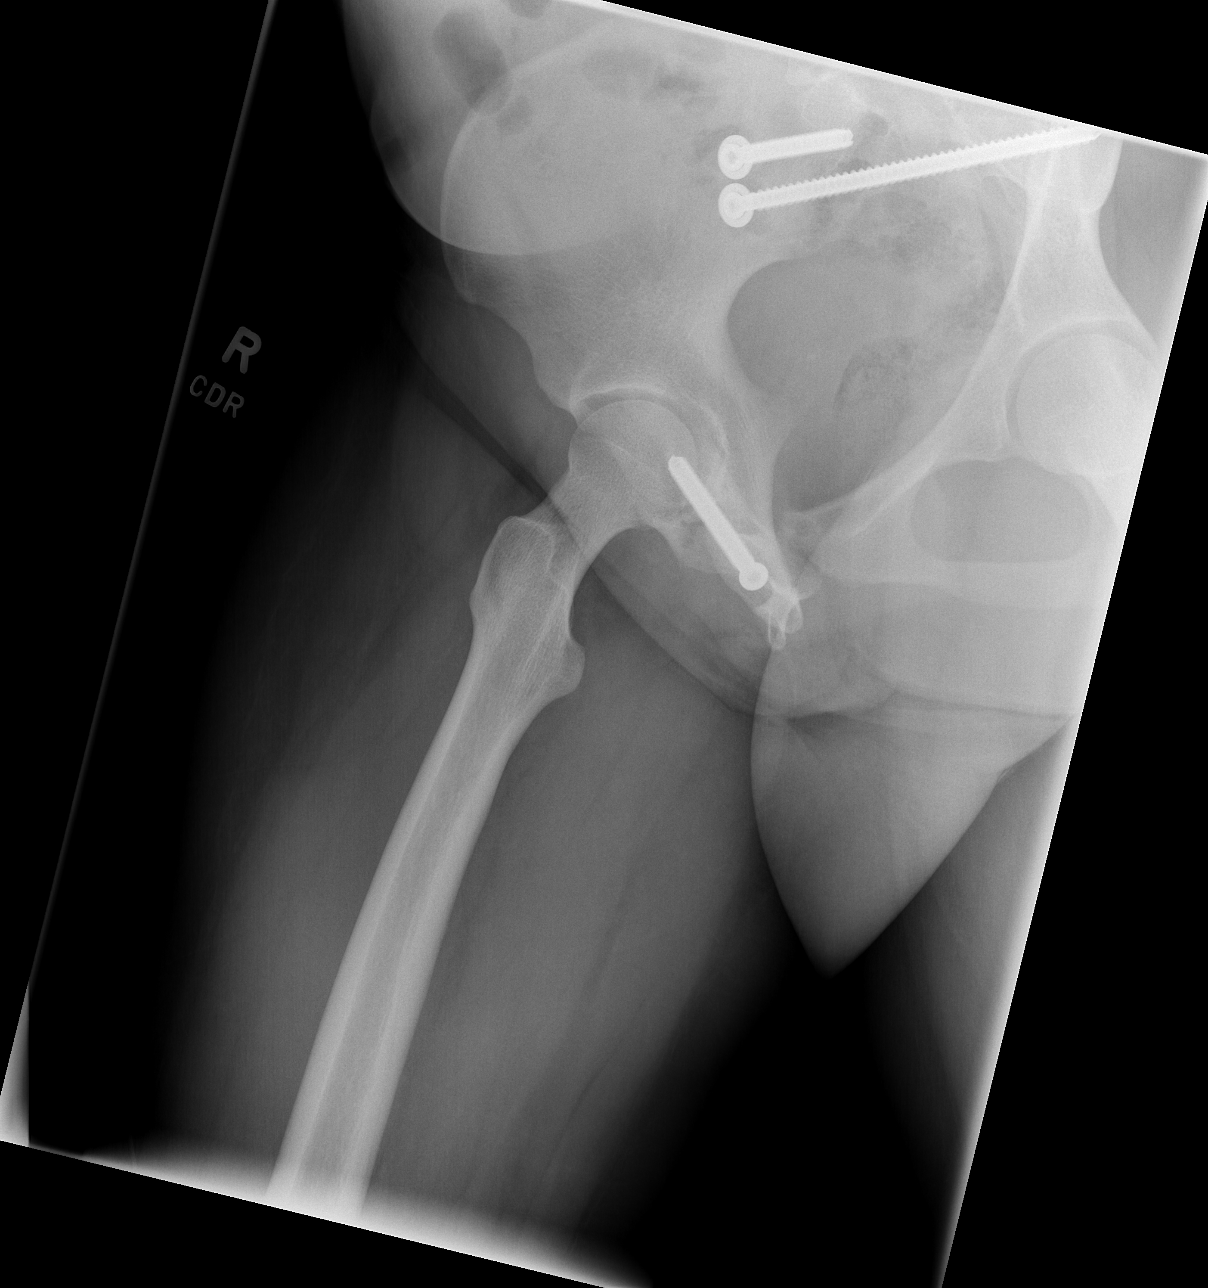

[3 of 3 positions shown; findings below may reference images not displayed]

FINDINGS: Status post surgical internal fixation of right sacroiliac joint as
well as right superior pubic ramus. Moderately displaced right
inferior pubic ramus fracture is noted. Minimally displaced right
sacral fracture is again noted. No other abnormality is noted.
IMPRESSION: Postsurgical changes as described above. Right sacral and inferior
pubic rami fractures are again noted as described on prior CT scan.

## 2022-04-22 DIAGNOSIS — Z419 Encounter for procedure for purposes other than remedying health state, unspecified: Secondary | ICD-10-CM | POA: Diagnosis not present

## 2022-05-03 ENCOUNTER — Ambulatory Visit: Payer: Medicaid Other | Admitting: Certified Nurse Midwife

## 2022-05-03 NOTE — Progress Notes (Unsigned)
   GYNECOLOGY OFFICE VISIT NOTE  History:   Shannon Hogan is a 22 y.o. [phone] here today for vaginal odor with brown discharge and she would like to confirm IUD still in place. She has no other issues I.e. pain with intercourse. She has occasional cramps.      Past Medical History:  Diagnosis Date   Pelvis fracture ([place])    Pregnancy induced hypertension     Past Surgical History:  Procedure Laterality Date   CESAREAN SECTION N/A 02/24/2019   Procedure: CESAREAN SECTION;  Surgeon: Sloan Leiter, MD;  Location: [facility];  Service: Obstetrics;  Laterality: N/A;   CESAREAN SECTION N/A 08/05/2021   Procedure: CESAREAN SECTION AND IUD INSERT;  Surgeon: Janyth Pupa, DO;  Location: [facility];  Service: Obstetrics;  Laterality: N/A;   SACRO-ILIAC PINNING N/A 08/26/2020   Procedure: SACRO-ILIAC PINNING;  Surgeon: Shona Needles, MD;  Location: [place];  Service: Orthopedics;  Laterality: N/A;    The following portions of the patient's history were reviewed and updated as appropriate: allergies, current medications, past family history, past medical history, past social history, past surgical history and problem list.   Health Maintenance:   Diagnosis  Date Value Ref Range Status  05/21/2020 (A)  Final   - Atypical squamous cells of undetermined significance (ASC-US)  05/21/2020 - Low grade squamous intraepithelial lesion (LSIL) (A)  Final    Review of Systems:  Pertinent items noted in HPI and remainder of comprehensive ROS otherwise negative.  Physical Exam:  BP 130/73   Pulse 89   Ht 5\' 2"  (1.575 m)   Wt 152 lb (68.9 kg)   BMI 27.80 kg/m  CONSTITUTIONAL: Well-developed, well-nourished female in no acute distress.  HEENT:  Normocephalic, atraumatic. External right and left ear normal. No scleral icterus.  NECK: Normal range of motion, supple, no masses noted on observation SKIN: No rash noted. Not diaphoretic. No erythema. No pallor. MUSCULOSKELETAL: Normal range of  motion. No edema noted. NEUROLOGIC: Alert and oriented to person, place, and time. Normal muscle tone coordination. No cranial nerve deficit noted. PSYCHIATRIC: Normal mood and affect. Normal behavior. Normal judgment and thought content.  PELVIC: Normal appearing external genitalia; normal urethral meatus; normal appearing vaginal mucosa and cervix.  No abnormal discharge noted.  IUD strings visualized . Performed in the presence of a chaperone  Labs and Imaging No results found for this or any previous visit (from the past 168 hour(s)). No results found.  Assessment and Plan:  [name] was seen today for vaginal discharge and iud placement.  Diagnoses and all orders for this visit:  IUD check up - Strings visualized - pt reassured.   Vaginal odor -     Cervicovaginal ancillary only( [place])  Cervical cancer screening - Reviewed prior abnormal pap. She is s/p gardasil.  -     Cytology - PAP  Routine screening for STI (sexually transmitted infection) Pt would like all STI screening today -     HIV antibody (with reflex) -     Hepatitis C Antibody -     Hepatitis B Surface AntiGEN -     RPR    Routine preventative health maintenance measures emphasized. Please refer to After Visit Summary for other counseling recommendations.   Return in about 1 year (around 05/04/2023) for annual.  Shannon Gunning, MD, [place], [facility] for [employer], [place]

## 2022-05-04 ENCOUNTER — Other Ambulatory Visit (HOSPITAL_COMMUNITY)
Admission: RE | Admit: 2022-05-04 | Discharge: 2022-05-04 | Disposition: A | Discharge status: Home / Self Care | Payer: Medicaid Other | Source: Ambulatory Visit | Attending: Obstetrics and Gynecology | Admitting: Obstetrics and Gynecology

## 2022-05-04 ENCOUNTER — Ambulatory Visit: Payer: Medicaid Other | Admitting: Obstetrics and Gynecology

## 2022-05-04 ENCOUNTER — Encounter: Payer: Self-pay | Admitting: Obstetrics and Gynecology

## 2022-05-04 VITALS — BP 130/73 | HR 89 | Ht 62.0 in | Wt 152.0 lb

## 2022-05-04 DIAGNOSIS — Z30431 Encounter for routine checking of intrauterine contraceptive device: Secondary | ICD-10-CM

## 2022-05-04 DIAGNOSIS — B9689 Other specified bacterial agents as the cause of diseases classified elsewhere: Secondary | ICD-10-CM | POA: Diagnosis not present

## 2022-05-04 DIAGNOSIS — Z124 Encounter for screening for malignant neoplasm of cervix: Secondary | ICD-10-CM | POA: Diagnosis not present

## 2022-05-04 DIAGNOSIS — N898 Other specified noninflammatory disorders of vagina: Secondary | ICD-10-CM | POA: Diagnosis not present

## 2022-05-04 DIAGNOSIS — N760 Acute vaginitis: Secondary | ICD-10-CM | POA: Diagnosis not present

## 2022-05-04 DIAGNOSIS — Z113 Encounter for screening for infections with a predominantly sexual mode of transmission: Secondary | ICD-10-CM

## 2022-05-05 LAB — CERVICOVAGINAL ANCILLARY ONLY
Bacterial Vaginitis (gardnerella): POSITIVE — AB
Candida Glabrata: NEGATIVE
Candida Vaginitis: NEGATIVE
Chlamydia: NEGATIVE
Comment: NEGATIVE
Comment: NEGATIVE
Comment: NEGATIVE
Comment: NEGATIVE
Comment: NEGATIVE
Comment: NORMAL
Neisseria Gonorrhea: NEGATIVE
Trichomonas: NEGATIVE

## 2022-05-05 MED ORDER — METRONIDAZOLE 500 MG PO TABS: 500.0000 mg | ORAL_TABLET | Freq: Two times a day (BID) | ORAL | 0 refills | Status: DC

## 2022-05-05 NOTE — Addendum Note (Signed)
Addended by: Radene Gunning A on: 05/05/2022 03:52 PM   Modules accepted: Orders

## 2022-05-08 LAB — CYTOLOGY - PAP
Diagnosis: NEGATIVE
Diagnosis: REACTIVE

## 2022-05-23 DIAGNOSIS — Z419 Encounter for procedure for purposes other than remedying health state, unspecified: Secondary | ICD-10-CM | POA: Diagnosis not present

## 2022-06-22 DIAGNOSIS — Z419 Encounter for procedure for purposes other than remedying health state, unspecified: Secondary | ICD-10-CM | POA: Diagnosis not present

## 2022-07-12 ENCOUNTER — Other Ambulatory Visit (HOSPITAL_COMMUNITY)
Admission: RE | Admit: 2022-07-12 | Discharge: 2022-07-12 | Disposition: A | Discharge status: Home / Self Care | Payer: Medicaid Other | Source: Ambulatory Visit | Attending: Student | Admitting: Student

## 2022-07-12 ENCOUNTER — Ambulatory Visit: Payer: Medicaid Other

## 2022-07-12 DIAGNOSIS — N898 Other specified noninflammatory disorders of vagina: Secondary | ICD-10-CM | POA: Insufficient documentation

## 2022-07-12 DIAGNOSIS — Z113 Encounter for screening for infections with a predominantly sexual mode of transmission: Secondary | ICD-10-CM | POA: Insufficient documentation

## 2022-07-12 NOTE — Progress Notes (Signed)
Pt here for self swab due to BV symptoms. Pt also request STI screening on swab. Self swab done and pt is aware we will contact with results and treat if needed.

## 2022-07-13 LAB — CERVICOVAGINAL ANCILLARY ONLY
Bacterial Vaginitis (gardnerella): POSITIVE — AB
Candida Glabrata: NEGATIVE
Candida Vaginitis: NEGATIVE
Chlamydia: NEGATIVE
Comment: NEGATIVE
Comment: NEGATIVE
Comment: NEGATIVE
Comment: NEGATIVE
Comment: NEGATIVE
Comment: NORMAL
Neisseria Gonorrhea: NEGATIVE
Trichomonas: NEGATIVE

## 2022-07-15 ENCOUNTER — Other Ambulatory Visit: Payer: Self-pay | Admitting: Student

## 2022-07-15 DIAGNOSIS — B9689 Other specified bacterial agents as the cause of diseases classified elsewhere: Secondary | ICD-10-CM

## 2022-07-15 MED ORDER — METRONIDAZOLE 500 MG PO TABS: 500.0000 mg | ORAL_TABLET | Freq: Two times a day (BID) | ORAL | 0 refills | Status: DC

## 2022-07-17 DIAGNOSIS — Z111 Encounter for screening for respiratory tuberculosis: Secondary | ICD-10-CM | POA: Diagnosis not present

## 2022-07-23 DIAGNOSIS — Z419 Encounter for procedure for purposes other than remedying health state, unspecified: Secondary | ICD-10-CM | POA: Diagnosis not present

## 2022-08-07 IMAGING — US US OB COMP LESS 14 WK
1 series · 15 of 28 positions shown · non-contrast
Comparison: None.

CLINICAL DATA: confirm dating

EXAM:
OBSTETRIC <14 WK ULTRASOUND
TECHNIQUE: Transabdominal ultrasound was performed for evaluation of the
gestation as well as the maternal uterus and adnexal regions.

[Series 1: us ob comp less 14 wk · 15 of 61 slices shown]
[im 1/61]
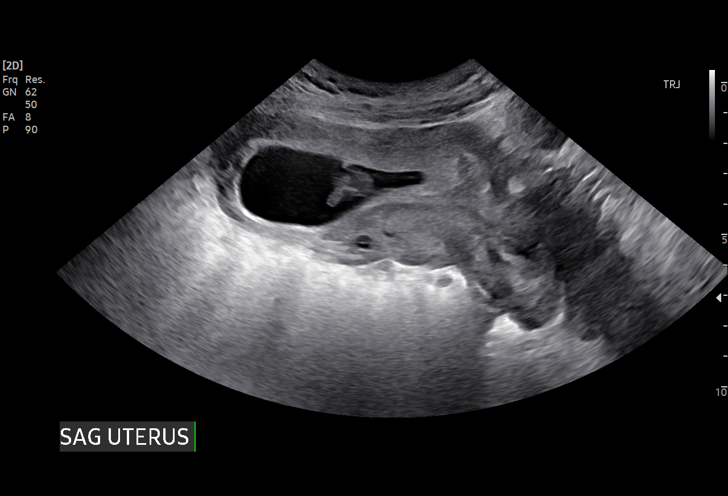
[im 5/61]
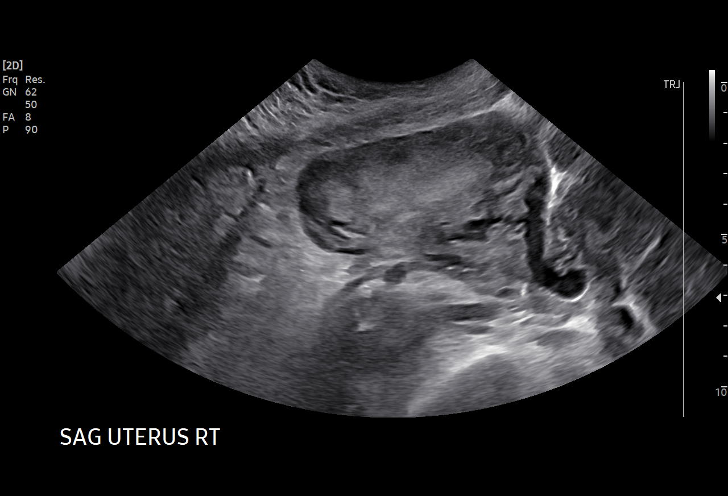
[im 9/61]
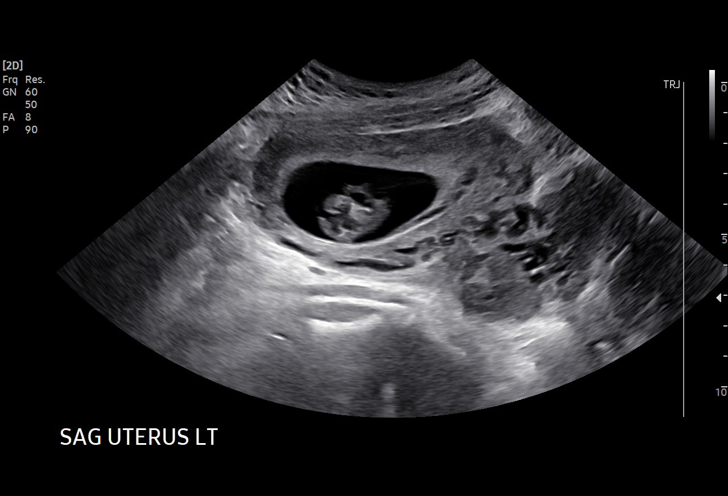
[im 14/61]
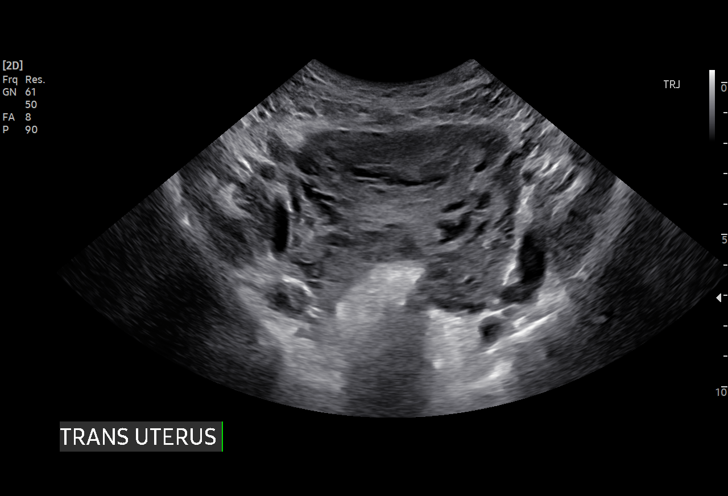
[im 18/61]
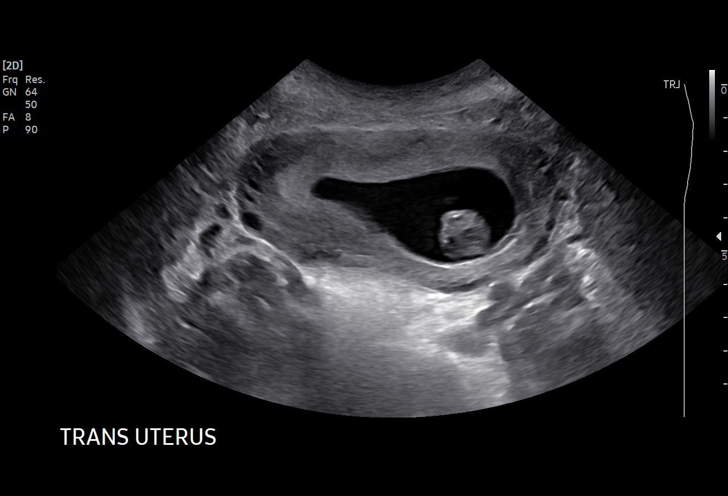
[im 23/61]
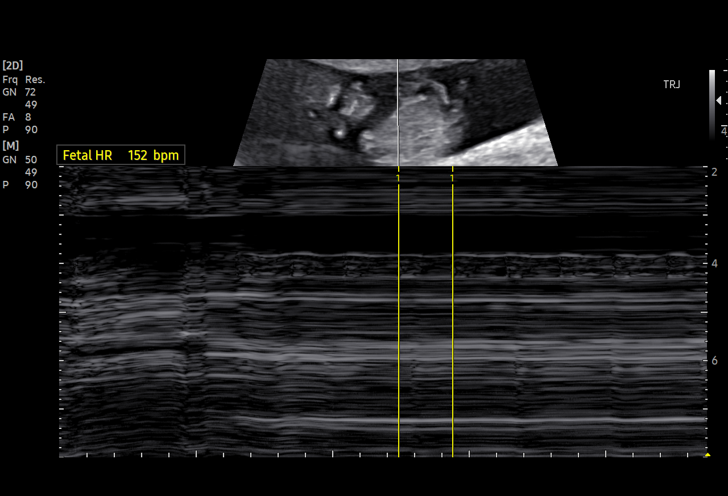
[im 27/61]
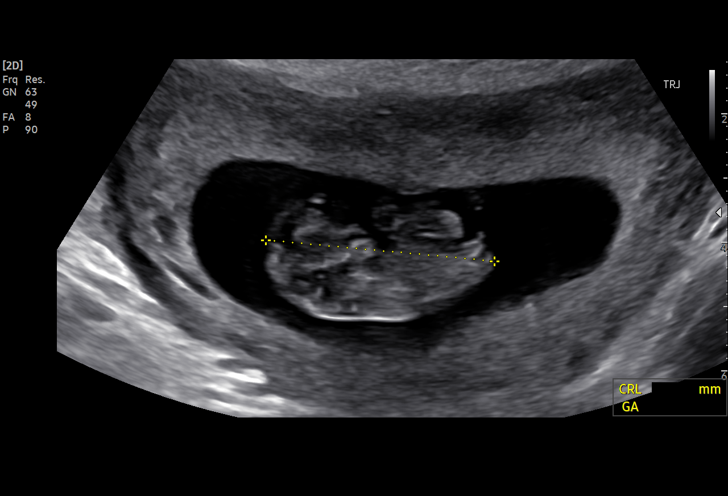
[im 32/61]
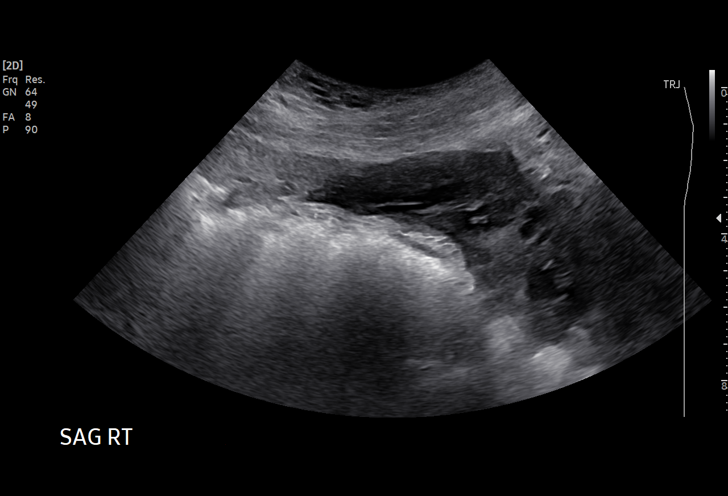
[im 34/61]
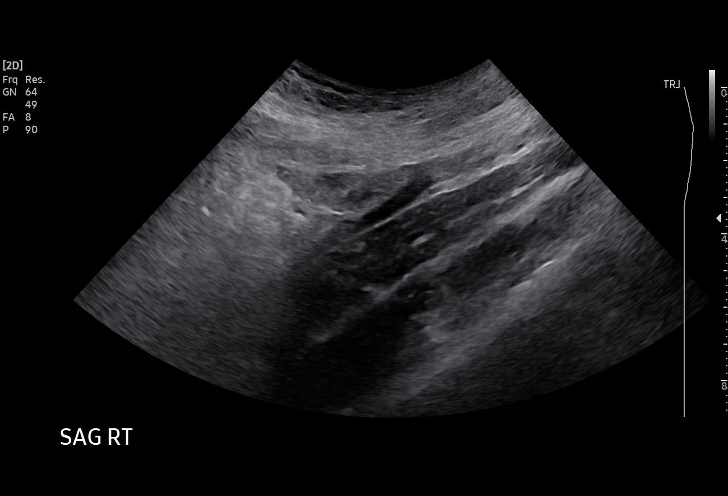
[im 38/61]
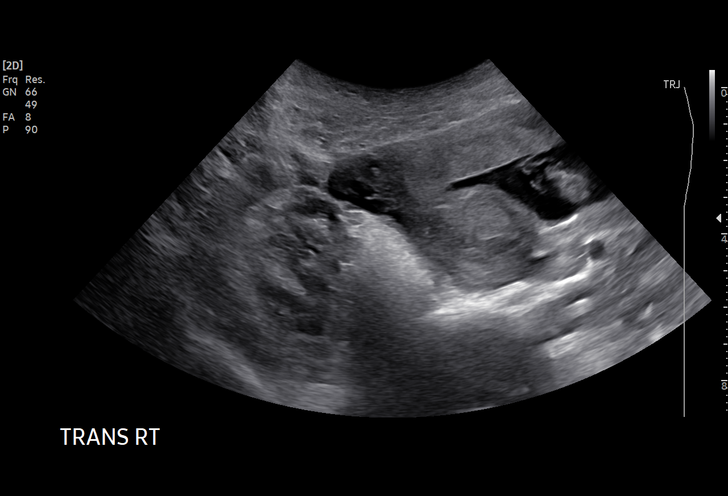
[im 43/61]
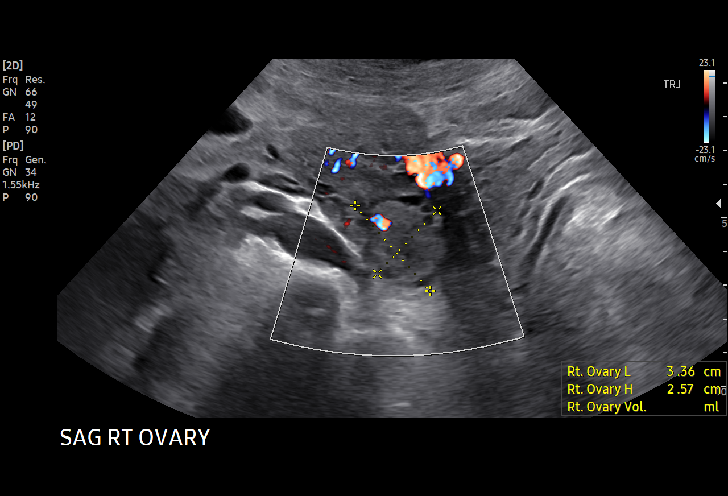
[im 47/61]
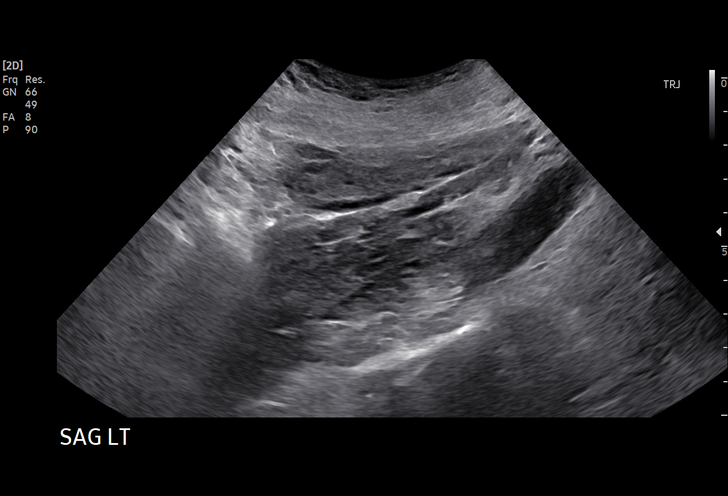
[im 52/61]
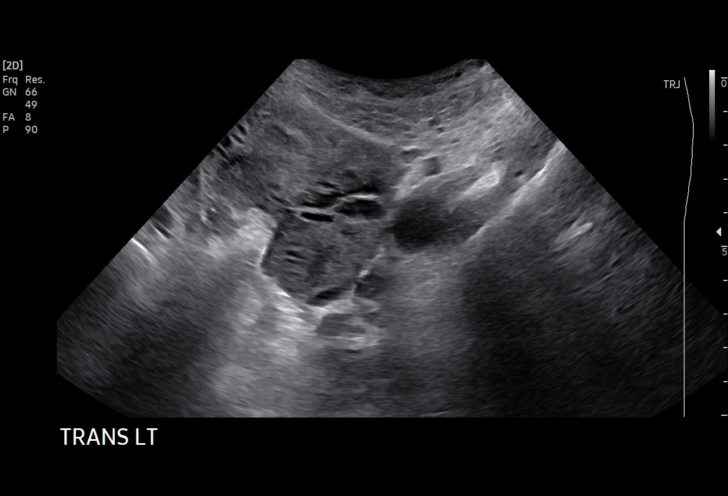
[im 56/61]
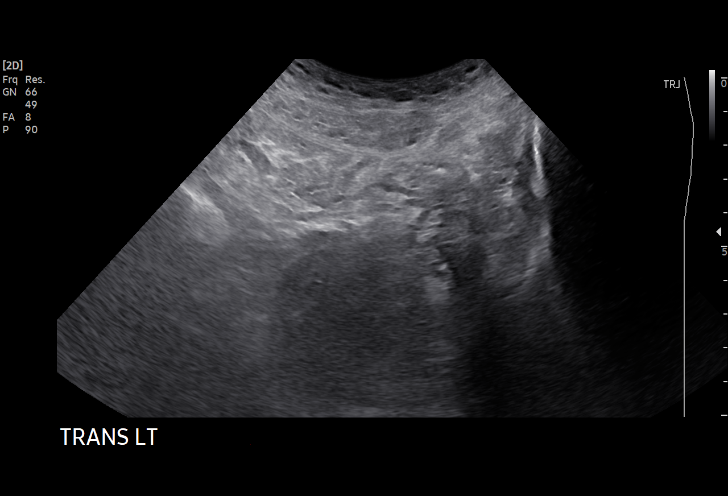
[im 61/61]
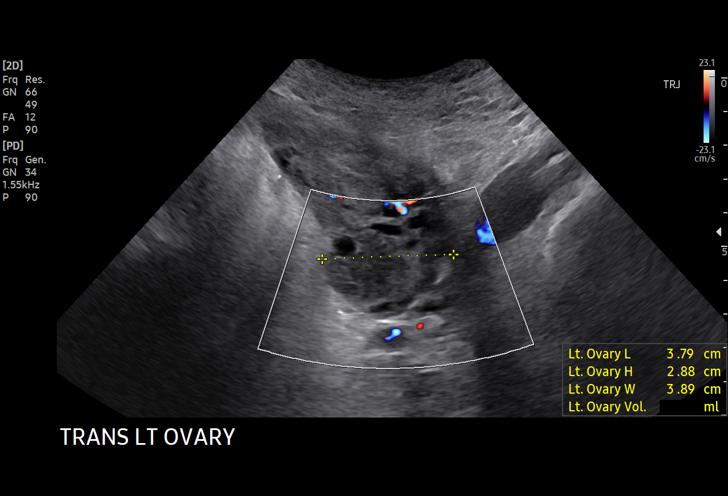

[15 of 28 positions shown; findings below may reference images not displayed]

FINDINGS: Intrauterine gestational sac: Single

Yolk sac:  Visualized.

Embryo:  Visualized.

Cardiac Activity: Visualized.

Heart Rate: 152 bpm

CRL:   35.2 mm   10 w 3 d                  US EDC: 08/13/2021

Subchorionic hemorrhage:  None visualized.

Maternal uterus/adnexae: Normal bilateral ovaries.
IMPRESSION: Single viable intrauterine pregnancy. Estimated gestational age 10
weeks 3 days.

## 2022-08-22 DIAGNOSIS — Z419 Encounter for procedure for purposes other than remedying health state, unspecified: Secondary | ICD-10-CM | POA: Diagnosis not present

## 2022-09-22 DIAGNOSIS — Z419 Encounter for procedure for purposes other than remedying health state, unspecified: Secondary | ICD-10-CM | POA: Diagnosis not present

## 2022-09-29 ENCOUNTER — Encounter: Payer: Self-pay | Admitting: Obstetrics and Gynecology

## 2022-09-29 ENCOUNTER — Ambulatory Visit: Payer: Medicaid Other | Admitting: Obstetrics and Gynecology

## 2022-09-29 VITALS — BP 114/75 | HR 90 | Ht 62.0 in | Wt 140.0 lb

## 2022-09-29 DIAGNOSIS — M6289 Other specified disorders of muscle: Secondary | ICD-10-CM | POA: Diagnosis not present

## 2022-09-29 DIAGNOSIS — Z01419 Encounter for gynecological examination (general) (routine) without abnormal findings: Secondary | ICD-10-CM | POA: Diagnosis not present

## 2022-09-29 NOTE — Progress Notes (Signed)
ANNUAL EXAM Patient name: Shannon Hogan MRN [id number]  Date of birth: 12-Dec-2000 Chief Complaint:   Annual Exam  History of Present Illness:   Shannon Hogan is a 22 y.o. [phone] with No LMP recorded. (Menstrual status: IUD). being seen today for a routine annual exam.  Current complaints:   Lower abdominal cramping 2-3x per week. Not currently sexually active but denies dyspareunia. Wants to make sure IUD is in place.   [facility] - 09/29/22 1936       Pregnancy Intention Screening   Does the patient want to become pregnant in the next year? N/A    Does the patient's partner want to become pregnant in the next year? N/A    Would the patient like to discuss contraceptive options today? No      Contraception Wrap Up   Current Method IUD or IUS    End Method IUD or IUS    Contraception Counseling Provided No    How was the end contraceptive method provided? N/A            The pregnancy intention screening data noted above was reviewed. Potential methods of contraception were discussed. The patient elected to proceed with IUD or IUS.   Last pap 05/04/22. Results were: NILM w/ HRHPV not done. H/O abnormal pap: yes LSIL 2023 Last mammogram: n/a. Results were: N/A. Family h/o breast cancer: no Last colonoscopy: n/a. Results were: N/A. Family h/o colorectal cancer: no HPV vaccine: unsure     02/12/2021   12:06 PM  Depression screen PHQ 2/9  Decreased Interest 0  PHQ - 2 Score 0  Altered sleeping 2  Tired, decreased energy 1  Change in appetite 1  Feeling bad or failure about yourself  0  Trouble concentrating 0  Moving slowly or fidgety/restless 0  Suicidal thoughts 0  PHQ-9 Score 4  Difficult doing work/chores Not difficult at all        02/12/2021   12:06 PM  GAD 7 : Generalized Anxiety Score  Nervous, Anxious, on Edge 0  Control/stop worrying 0  Worry too much - different things 0  Trouble relaxing 0  Restless 0  Easily annoyed or irritable 0  Afraid -  awful might happen 0  Total GAD 7 Score 0  Anxiety Difficulty Not difficult at all     Review of Systems:   Pertinent items are noted in HPI Denies any headaches, blurred vision, fatigue, shortness of breath, chest pain, abdominal pain, abnormal vaginal discharge/itching/odor/irritation, problems with periods, bowel movements, urination, or intercourse unless otherwise stated above. Pertinent History Reviewed:  Reviewed past medical,surgical, social and family history.  Reviewed problem list, medications and allergies. Physical Assessment:   Vitals:   09/29/22 1501  BP: 114/75  Pulse: 90  Weight: 140 lb (63.5 kg)  Height: 5\' 2"  (1.575 m)  Body mass index is 25.61 kg/m.        Physical Examination:   General appearance - well appearing, and in no distress  Mental status - alert, oriented to person, place, and time  Chest - respiratory effort normal  Heart - normal peripheral perfusion  Breasts - breasts appear normal, no suspicious masses, no skin or nipple changes or axillary nodes  Abdomen - soft, nontender, nondistended, no masses or organomegaly  Pelvic - VULVA: normal appearing vulva with no masses, tenderness or lesions  VAGINA: normal appearing vagina with normal color and discharge, no lesions + obturator tenderness R>L CERVIX: partially visualized, dark brown discharge/blood w/ no active bleeding,  strings not well visualized w/ discharge but easily palpated on BME  UTERUS: uterus is felt to be normal size, shape, consistency and nontender   ADNEXA: No adnexal masses or tenderness noted.  Chaperone present for exam  No results found for this or any previous visit (from the past 24 hour(s)).  Assessment & Plan:  1) Well-Woman Exam Mammogram: @ 24yo, or sooner if problems Colonoscopy: @ 24yo, or sooner if problems Pap: due 04/2023 Gardasil: pt will check her records and see if she has received. Reviewed rationale for recommendation and that we have vaccine available at  our office GC/CT: UTD, negative 06/2022 HIV/HCV: ordered  2) Pelvic floor dysfunction Cramping pain reproducible with palpation of obturators PFPT referral placed  Labs/procedures today:   Orders Placed This Encounter  Procedures   HIV antibody (with reflex)   Hepatitis C Antibody   Hepatitis B Surface AntiGEN   RPR   Ambulatory referral to Physical Therapy   Meds: No orders of the defined types were placed in this encounter.   Follow-up: Return in about 1 year (around 09/29/2023) for annual exam or sooner as needed.  Lennart Pall, MD 09/29/2022 7:36 PM

## 2022-11-09 ENCOUNTER — Ambulatory Visit: Payer: Medicaid Other | Attending: Physical Therapy | Admitting: Physical Therapy

## 2022-11-16 ENCOUNTER — Encounter: Payer: Medicaid Other | Admitting: Physical Therapy

## 2022-11-23 ENCOUNTER — Encounter: Payer: Medicaid Other | Admitting: Physical Therapy

## 2022-11-30 ENCOUNTER — Encounter: Payer: Medicaid Other | Admitting: Physical Therapy

## 2023-02-12 IMAGING — US US MFM OB FOLLOW-UP
1 series · 13 of 18 positions shown · non-contrast
Comparison: none

[Series 1: us mfm ob follow-up · 18 acquisitions, 13 frames shown]
[im 1/18]
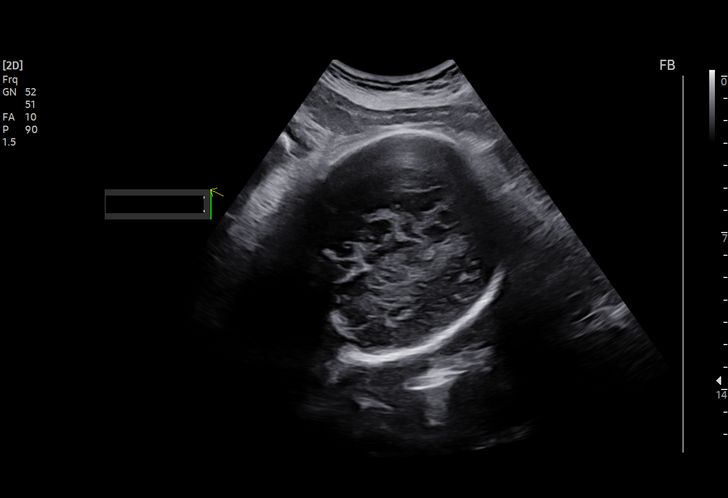
[im 3/18]
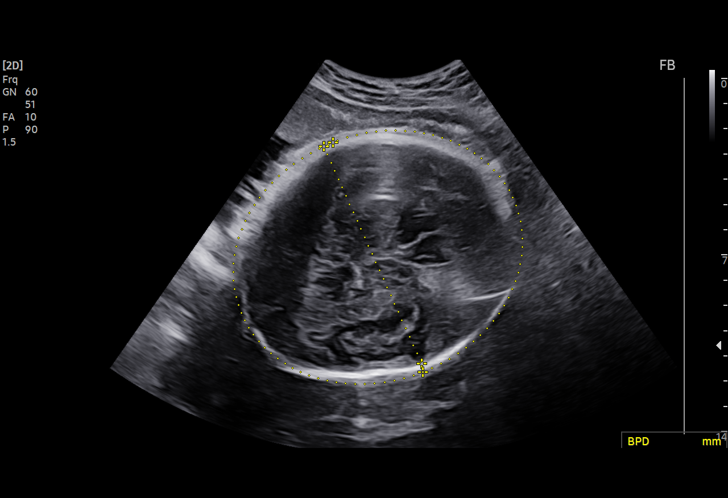
[im 4/18]
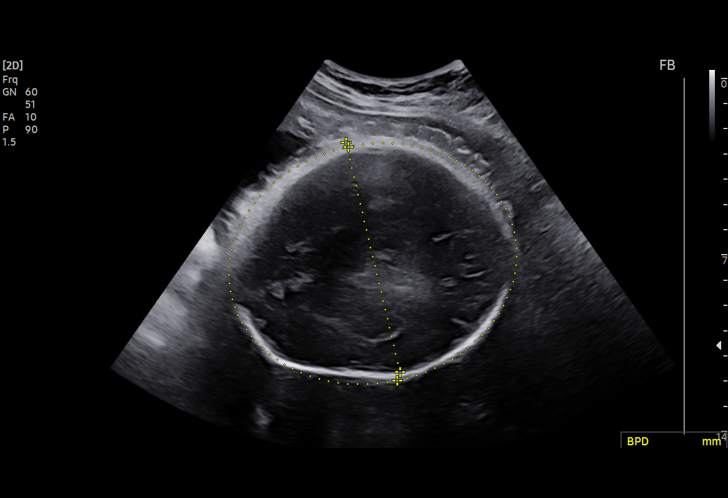
[im 5/18]
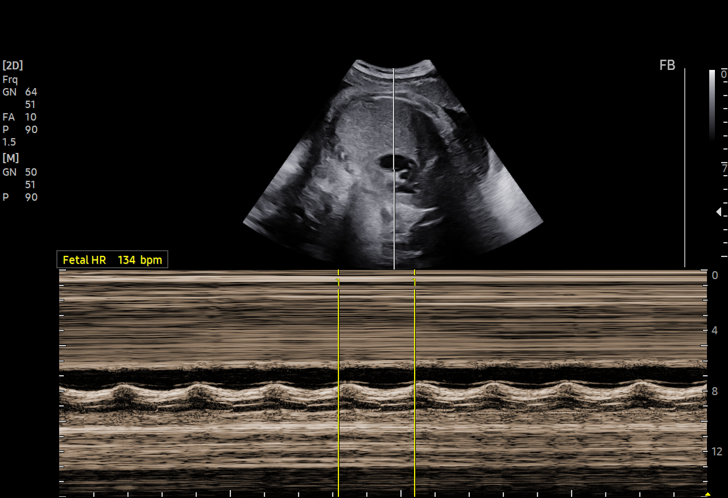
[im 7/18]
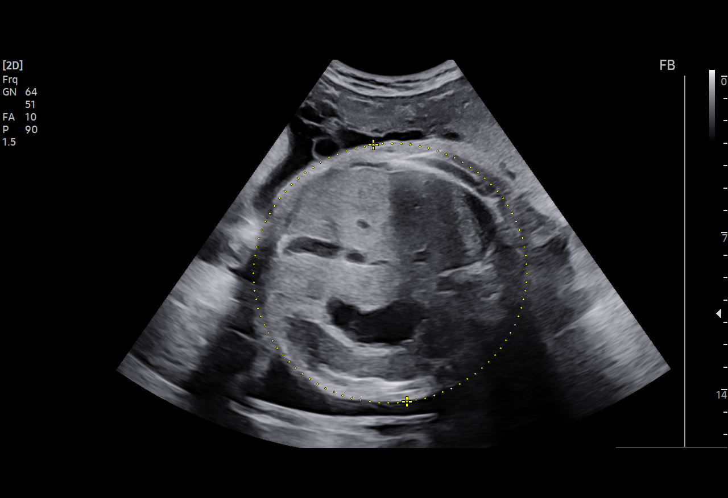
[im 8/18]
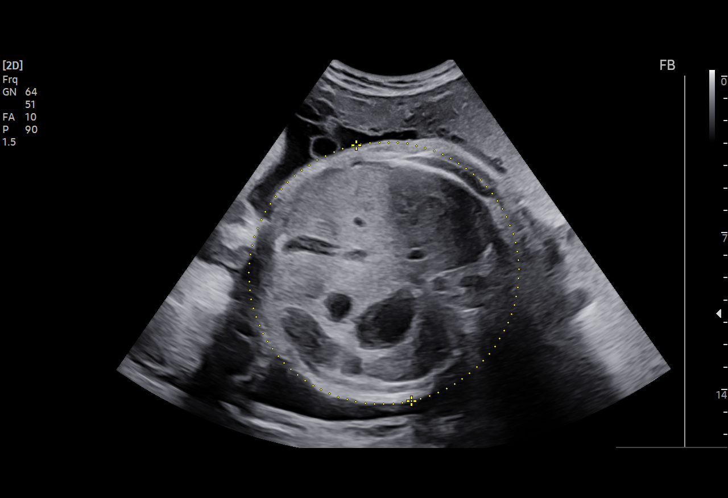
[im 10/18]
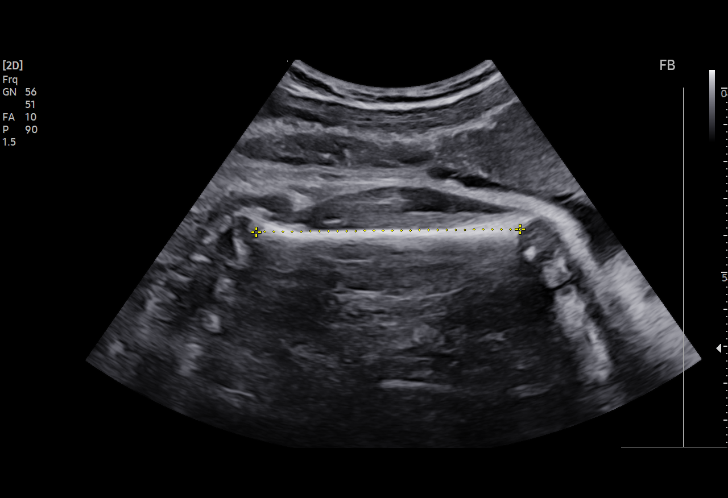
[im 11/18]
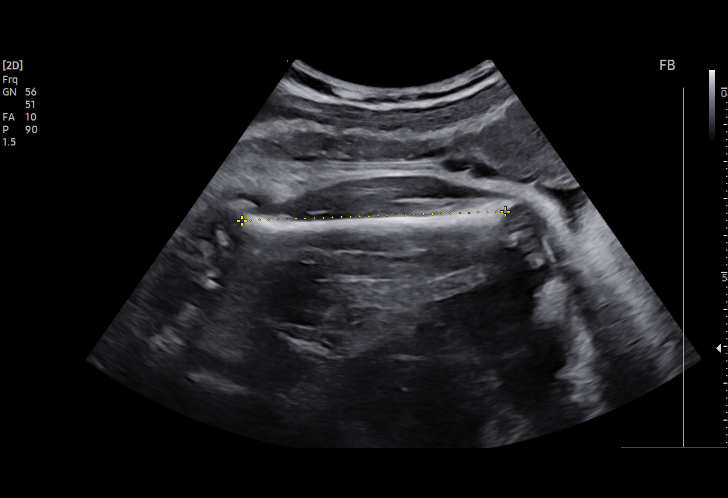
[im 12/18]
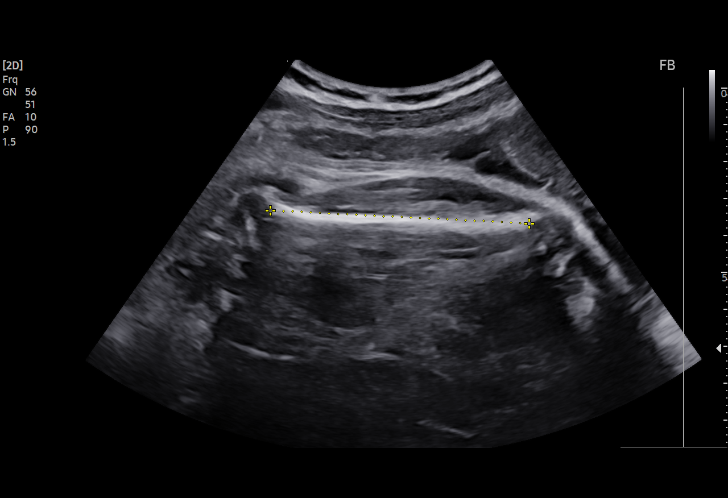
[im 14/18]
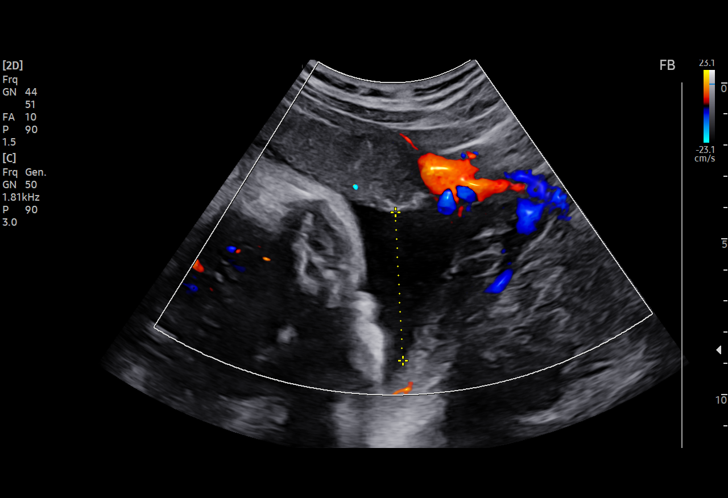
[im 15/18]
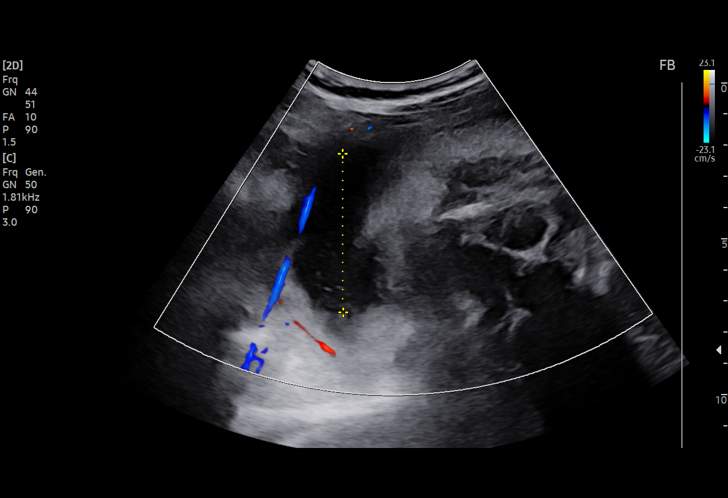
[im 16/18]
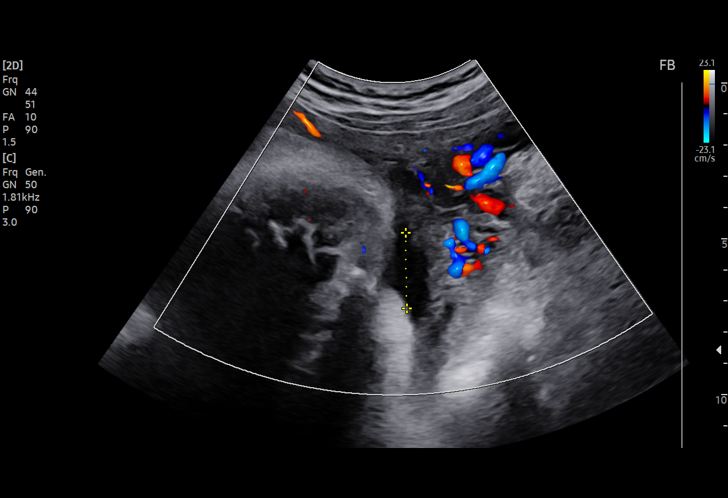
[im 18/18]
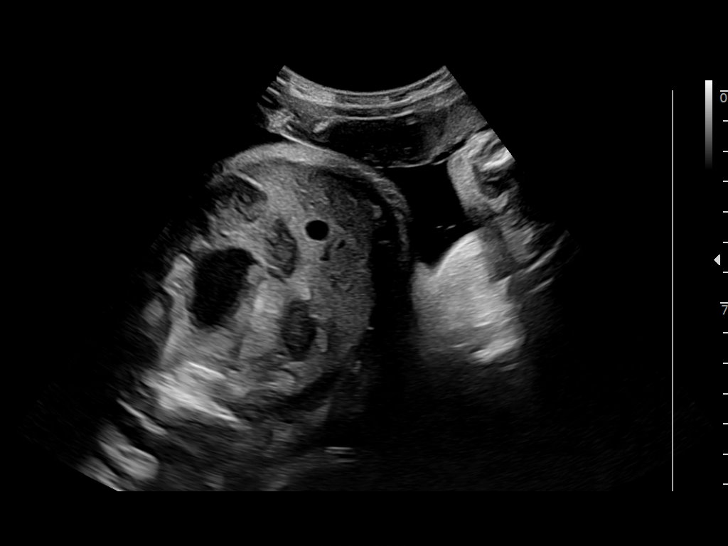

[13 of 18 positions shown; findings below may reference images not displayed]

HULDER

Indications

 Size of fetus inconsistent with dates in third
 trimester
 Low lying placenta, antepartum (resolved)
 Genetic carrier (silent carrier alpha
 thalassemia)
 History of cesarean delivery, currently
 pregnant
 Anemia during pregnancy in third trimester
 Poor obstetric history: Previous
 preeclampsia / eclampsia/gestational HTN
 37 weeks gestation of pregnancy
 LR NIPS
Fetal Evaluation

 Num Of Fetuses:         1
 Fetal Heart Rate(bpm):  134
 Cardiac Activity:       Observed
 Presentation:           Cephalic
 Placenta:               Anterior
 P. Cord Insertion:      Previously Visualized

 Amniotic Fluid
 AFI FV:      Within normal limits

 AFI Sum(cm)     %Tile       Largest Pocket(cm)
 16.95           65

 RUQ(cm)       RLQ(cm)       LUQ(cm)        LLQ(cm)

Biometry

 BPD:     92.98  mm     G. Age:  37w 6d         76  %    CI:        77.55   %    70 - 86
                                                         FL/HC:      21.2   %    20.9 -
 HC:    334.21   mm     G. Age:  38w 1d         42  %    HC/AC:      0.90        0.92 -
 AC:    372.12   mm     G. Age:  41w 1d       > 99  %    FL/BPD:     76.3   %    71 - 87
 FL:       70.9  mm     G. Age:  36w 2d         21  %    FL/AC:      19.1   %    20 - 24

 Est. FW:    8903  gm      8 lb 5 oz     94  %
OB History

 Blood Type:   A+
 Gravidity:    2         Term:   1
 Living:       1
Gestational Age

 LMP:           37w 4d        Date:  11/05/20                 EDD:   08/12/21
 U/S Today:     38w 3d                                        EDD:   08/06/21
 Best:          37w 4d     Det. By:  LMP  (11/05/20)          EDD:   08/12/21
Anatomy

 Cranium:               Appears normal         Aortic Arch:            Previously seen
 Cavum:                 Appears normal         Ductal Arch:            Previously seen
 Ventricles:            Previously seen        Diaphragm:              Appears normal
 Choroid Plexus:        Previously seen        Stomach:                Appears normal, left
                                                                       sided
 Cerebellum:            Previously seen        Abdomen:                Appears normal
 Posterior Fossa:       Previously seen        Abdominal Wall:         Previously seen
 Nuchal Fold:           Not applicable (>20    Cord Vessels:           Appears normal (3
                        wks GA)                                        vessel cord)
 Face:                  Orbits and profile     Kidneys:                Appear normal
                        previously seen
 Lips:                  Previously seen        Bladder:                Appears normal
 Thoracic:              Appears normal         Spine:                  Previously seen
 Heart:                 Previously seen        Upper Extremities:      Previously seen
 RVOT:                  Previously seen        Lower Extremities:      Previously seen
 LVOT:                  Previously seen

 Other:  Fetus appears to be female. VC, 3VV, 3VTV, nasal bone, lenses,
         maxilla, mandible, falx, heels/feet and open hands/5th digits
         previously  visualized.
Cervix Uterus Adnexa

 Adnexa
 No abnormality visualized.
Comments

 This patient was seen for a follow up growth scan as her
 fundal heights have measured smaller than her dates.  She
 denies any other problems since her last exam.
 The overall EFW obtained today measures at the 94th
 percentile for her gestational age.  There was normal
 amniotic fluid noted.
 The patient already has a repeat C-section scheduled on
 August 05, 2021 (at 39 weeks).
 No further exams were scheduled in our office.

## 2023-02-23 ENCOUNTER — Ambulatory Visit: Payer: Medicaid Other | Admitting: Physical Therapy

## 2023-02-28 ENCOUNTER — Ambulatory Visit: Payer: Medicaid Other | Attending: Obstetrics and Gynecology | Admitting: Physical Therapy

## 2023-03-02 ENCOUNTER — Encounter: Payer: Medicaid Other | Admitting: Physical Therapy

## 2023-03-09 ENCOUNTER — Encounter: Payer: Medicaid Other | Admitting: Physical Therapy

## 2023-03-16 ENCOUNTER — Encounter: Payer: Medicaid Other | Admitting: Physical Therapy

## 2023-05-10 ENCOUNTER — Encounter: Payer: Self-pay | Admitting: Obstetrics and Gynecology

## 2023-05-10 ENCOUNTER — Ambulatory Visit: Admitting: Obstetrics and Gynecology

## 2023-05-10 ENCOUNTER — Other Ambulatory Visit (HOSPITAL_COMMUNITY)
Admission: RE | Admit: 2023-05-10 | Discharge: 2023-05-10 | Disposition: A | Discharge status: Home / Self Care | Source: Ambulatory Visit | Attending: Obstetrics and Gynecology | Admitting: Obstetrics and Gynecology

## 2023-05-10 VITALS — BP 113/69 | HR 89 | Ht 62.0 in | Wt 149.0 lb

## 2023-05-10 DIAGNOSIS — N898 Other specified noninflammatory disorders of vagina: Secondary | ICD-10-CM | POA: Insufficient documentation

## 2023-05-10 NOTE — Patient Instructions (Signed)
 Avoid: - Synthetic underwear - Tight pants - Swim suits, thongs, leotards, leggings for prolonged periods of time - Scented soap/shampoo - Bubble baths - Scented detergents, dryer sheets - Baby wipes - Feminine sprays, douches, powders - Panty liners - Dyed toilet paper - Shaving  Trying swapping out the above for: - Cotton or no underwear - Loose pants, skirts, dresses - Changing out of swimwear, thongs, and workout gear as soon as you're done exercising - Fragrance free soaps (like Dove sensitive skin) - Warm plain water baths - Unscented laundry detergent - Use a bedet or peri bottle to rinse instead of baby wipes - Tampons, cotton pads, cotton period underwear - Undyed toilet paper - Clipping hair

## 2023-05-10 NOTE — Progress Notes (Signed)
   RETURN GYNECOLOGY VISIT  Subjective:  Shannon Hogan is a 23 y.o. [phone] with IUD in place presenting for vaginal discharge & odor  Present for 1.5 weeks. Thick white discharge then odor started more recently. Notes itching. No fevers, chills, or abdominal pain. Otherwise feeling well.   I personally reviewed the following: - CV ancillary 07/12/22 +BV  Objective:   Vitals:   05/10/23 1508  BP: 113/69  Pulse: 89  Weight: 149 lb (67.6 kg)  Height: 5\' 2"  (1.575 m)   General:  Alert, oriented and cooperative. Patient is in no acute distress.  Skin: Skin is warm and dry. No rash noted.   Cardiovascular: Normal heart rate noted  Respiratory: Normal respiratory effort, no problems with respiration noted  Abdomen: Soft, non-tender, non-distended   Pelvic: NEFG. Significant resting tone of levators.  Thin greyish discharge noted. IUD strings visualized.   Exam performed in the presence of a chaperone  Assessment and Plan:  Shannon Hogan is a 23 y.o. with vaginal discharge  Vaginal discharge Suspect BV, will confirm with swab and send treatment. Pt OK w/ metrogel Reviewed vulvar care, discussed limited OTC options for prevention but if we confirm recurrent VVC or recurrent BV we can talk about medications and/or partner treatment (BV) to try to prevent recurrence -     Cervicovaginal ancillary only( [place])  Return if symptoms worsen or fail to improve.  Lennart Pall, MD

## 2023-05-11 LAB — CERVICOVAGINAL ANCILLARY ONLY
Bacterial Vaginitis (gardnerella): POSITIVE — AB
Candida Glabrata: NEGATIVE
Candida Vaginitis: NEGATIVE
Chlamydia: NEGATIVE
Comment: NEGATIVE
Comment: NEGATIVE
Comment: NEGATIVE
Comment: NEGATIVE
Comment: NEGATIVE
Comment: NORMAL
Neisseria Gonorrhea: NEGATIVE
Trichomonas: NEGATIVE

## 2023-05-12 ENCOUNTER — Encounter: Payer: Self-pay | Admitting: Obstetrics and Gynecology

## 2023-05-12 MED ORDER — METRONIDAZOLE 0.75 % VA GEL: 1.0000 | Freq: Every day | VAGINAL | 0 refills | Status: DC

## 2023-05-12 NOTE — Addendum Note (Signed)
 Addended by: Harvie Bridge on: 05/12/2023 02:00 PM   Modules accepted: Orders

## 2023-05-16 MED ORDER — METRONIDAZOLE 0.75 % VA GEL: 1.0000 | VAGINAL | 0 refills | Status: AC

## 2023-06-12 ENCOUNTER — Encounter (HOSPITAL_COMMUNITY): Payer: Self-pay

## 2023-06-12 ENCOUNTER — Emergency Department (HOSPITAL_COMMUNITY)
Admission: EM | Admit: 2023-06-12 | Discharge: 2023-06-12 | Disposition: A | Discharge status: Home / Self Care | Attending: Emergency Medicine | Admitting: Emergency Medicine

## 2023-06-12 ENCOUNTER — Emergency Department (HOSPITAL_COMMUNITY)

## 2023-06-12 ENCOUNTER — Other Ambulatory Visit: Payer: Self-pay

## 2023-06-12 DIAGNOSIS — W25XXXA Contact with sharp glass, initial encounter: Secondary | ICD-10-CM | POA: Diagnosis not present

## 2023-06-12 DIAGNOSIS — Z23 Encounter for immunization: Secondary | ICD-10-CM | POA: Insufficient documentation

## 2023-06-12 DIAGNOSIS — S51812A Laceration without foreign body of left forearm, initial encounter: Secondary | ICD-10-CM | POA: Diagnosis not present

## 2023-06-12 DIAGNOSIS — S61512A Laceration without foreign body of left wrist, initial encounter: Secondary | ICD-10-CM | POA: Insufficient documentation

## 2023-06-12 DIAGNOSIS — S6992XA Unspecified injury of left wrist, hand and finger(s), initial encounter: Secondary | ICD-10-CM | POA: Diagnosis present

## 2023-06-12 MED ORDER — TETANUS-DIPHTH-ACELL PERTUSSIS 5-2.5-18.5 LF-MCG/0.5 IM SUSY
0.5000 mL | PREFILLED_SYRINGE | Freq: Once | INTRAMUSCULAR | Status: AC
  Administered 2023-06-12: 0.5 mL via INTRAMUSCULAR
  Filled 2023-06-12: qty 0.5

## 2023-06-12 MED ORDER — ACETAMINOPHEN 500 MG PO TABS
1000.0000 mg | ORAL_TABLET | Freq: Once | ORAL | Status: AC
  Administered 2023-06-12: 1000 mg via ORAL
  Filled 2023-06-12: qty 2

## 2023-06-12 MED ORDER — OXYCODONE HCL 5 MG PO TABS
5.0000 mg | ORAL_TABLET | Freq: Once | ORAL | Status: AC
  Administered 2023-06-12: 5 mg via ORAL
  Filled 2023-06-12: qty 1

## 2023-06-12 MED ORDER — LIDOCAINE HCL (PF) 1 % IJ SOLN
20.0000 mL | Freq: Once | INTRAMUSCULAR | Status: AC
  Administered 2023-06-12: 20 mL via INTRADERMAL
  Filled 2023-06-12: qty 20

## 2023-06-12 MED ORDER — FENTANYL CITRATE PF 50 MCG/ML IJ SOSY
50.0000 ug | PREFILLED_SYRINGE | Freq: Once | INTRAMUSCULAR | Status: AC
  Administered 2023-06-12: 50 ug via INTRAVENOUS
  Filled 2023-06-12: qty 1

## 2023-06-12 MED ORDER — IBUPROFEN 600 MG PO TABS: 600.0000 mg | ORAL_TABLET | Freq: Four times a day (QID) | ORAL | 0 refills | Status: AC | PRN

## 2023-06-12 MED ORDER — OXYCODONE HCL 5 MG PO TABS: 5.0000 mg | ORAL_TABLET | Freq: Four times a day (QID) | ORAL | 0 refills | Status: AC | PRN

## 2023-06-12 NOTE — Discharge Instructions (Addendum)
 We evaluated you for your laceration.  We cleaned out your wound and repaired your wound with stitches.  Please have these removed in 10 to 14 days.  We cleaned your wound thoroughly and did not see any glass inside your wound.  Although unlikely, it is possible there could be a small piece of glass stuck in the wound.  If you notice any persistent sensation of a foreign body, please come back to the emergency department.  Please also keep a close eye on your laceration.  You can wash it gently with soap and water  and apply antibiotic ointment to take care of it.  Please do not immerse it in water .  Try to avoid heavy lifting while it is still healing.  If you notice any signs of redness, swelling, drainage of pus, increasing pain, or have a fever, please return to the emergency department.  Please take Tylenol  (acetaminophen ) and Motrin  (ibuprofen ) for your symptoms at home.  You can take 1000 mg of Tylenol  every 6 hours and 600 mg of Motrin  every 6 hours as needed for your symptoms.  You can take these medicines together as needed, either at the same time, or alternating every 3 hours.  We have also prescribed you small amount of oxycodone .  Please only take this if needed for pain uncontrolled by Tylenol  and Motrin .  Do not mix this medication with alcohol or drive while taking this medicine as it may make you drowsy.
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
## 2023-06-12 NOTE — ED Triage Notes (Addendum)
 Pt was pushing through glass door and got left wrist laceration. Tourniquet applied 1254. Pt arrives with it bandaged. Bleeding controlled at this time

## 2023-10-19 ENCOUNTER — Ambulatory Visit
